# Patient Record
Sex: Female | Born: 1960 | Race: Black or African American | Hispanic: No | Smoking: Never smoker
Health system: Southern US, Community
[De-identification: ages and names within clinical notes are randomized; demographics above are authoritative.]

## PROBLEM LIST (undated history)

## (undated) DIAGNOSIS — K602 Anal fissure, unspecified: Secondary | ICD-10-CM

## (undated) DIAGNOSIS — T7840XA Allergy, unspecified, initial encounter: Secondary | ICD-10-CM

## (undated) DIAGNOSIS — S39012A Strain of muscle, fascia and tendon of lower back, initial encounter: Secondary | ICD-10-CM

## (undated) DIAGNOSIS — D509 Iron deficiency anemia, unspecified: Secondary | ICD-10-CM

## (undated) DIAGNOSIS — N921 Excessive and frequent menstruation with irregular cycle: Secondary | ICD-10-CM

## (undated) HISTORY — DX: Strain of muscle, fascia and tendon of lower back, initial encounter: S39.012A

## (undated) HISTORY — DX: Allergy, unspecified, initial encounter: T78.40XA

---

## 1997-09-25 ENCOUNTER — Emergency Department (HOSPITAL_COMMUNITY): Admission: EM | Admit: 1997-09-25 | Discharge: 1997-09-25 | Payer: Self-pay | Admitting: Emergency Medicine

## 1998-07-02 ENCOUNTER — Other Ambulatory Visit: Admission: RE | Admit: 1998-07-02 | Discharge: 1998-07-02 | Payer: Self-pay | Admitting: *Deleted

## 1999-01-12 ENCOUNTER — Ambulatory Visit (HOSPITAL_COMMUNITY): Admission: RE | Admit: 1999-01-12 | Discharge: 1999-01-12 | Payer: Self-pay | Admitting: Chiropractic Medicine

## 1999-01-12 ENCOUNTER — Encounter: Payer: Self-pay | Admitting: Chiropractic Medicine

## 1999-05-01 ENCOUNTER — Inpatient Hospital Stay (HOSPITAL_COMMUNITY): Admission: AD | Admit: 1999-05-01 | Discharge: 1999-05-01 | Payer: Self-pay | Admitting: Gynecology

## 1999-05-11 ENCOUNTER — Other Ambulatory Visit: Admission: RE | Admit: 1999-05-11 | Discharge: 1999-05-11 | Payer: Self-pay | Admitting: Gynecology

## 1999-05-24 ENCOUNTER — Ambulatory Visit (HOSPITAL_COMMUNITY): Admission: RE | Admit: 1999-05-24 | Discharge: 1999-05-24 | Payer: Self-pay | Admitting: Gynecology

## 1999-07-30 ENCOUNTER — Encounter: Payer: Self-pay | Admitting: Gynecology

## 1999-07-30 ENCOUNTER — Encounter (INDEPENDENT_AMBULATORY_CARE_PROVIDER_SITE_OTHER): Payer: Self-pay | Admitting: Specialist

## 1999-07-30 ENCOUNTER — Inpatient Hospital Stay (HOSPITAL_COMMUNITY): Admission: AD | Admit: 1999-07-30 | Discharge: 1999-08-01 | Payer: Self-pay | Admitting: Gynecology

## 2001-07-08 ENCOUNTER — Encounter: Payer: Self-pay | Admitting: Internal Medicine

## 2001-07-08 ENCOUNTER — Encounter: Admission: RE | Admit: 2001-07-08 | Discharge: 2001-07-08 | Payer: Self-pay | Admitting: Internal Medicine

## 2002-07-29 ENCOUNTER — Other Ambulatory Visit: Admission: RE | Admit: 2002-07-29 | Discharge: 2002-07-29 | Payer: Self-pay | Admitting: Obstetrics and Gynecology

## 2002-10-04 ENCOUNTER — Emergency Department (HOSPITAL_COMMUNITY): Admission: EM | Admit: 2002-10-04 | Discharge: 2002-10-04 | Payer: Self-pay | Admitting: Emergency Medicine

## 2002-10-04 ENCOUNTER — Encounter: Payer: Self-pay | Admitting: Hospitalist

## 2003-07-30 ENCOUNTER — Other Ambulatory Visit: Admission: RE | Admit: 2003-07-30 | Discharge: 2003-07-30 | Payer: Self-pay | Admitting: Obstetrics and Gynecology

## 2009-09-05 ENCOUNTER — Emergency Department (HOSPITAL_COMMUNITY): Admission: EM | Admit: 2009-09-05 | Discharge: 2009-09-05 | Payer: Self-pay | Admitting: Emergency Medicine

## 2009-10-29 ENCOUNTER — Emergency Department (HOSPITAL_COMMUNITY): Admission: EM | Admit: 2009-10-29 | Discharge: 2009-10-29 | Payer: Self-pay | Admitting: Family Medicine

## 2009-11-05 ENCOUNTER — Ambulatory Visit (HOSPITAL_COMMUNITY): Admission: RE | Admit: 2009-11-05 | Discharge: 2009-11-05 | Payer: Self-pay | Admitting: Chiropractic Medicine

## 2010-07-11 LAB — RAPID STREP SCREEN (MED CTR MEBANE ONLY): Streptococcus, Group A Screen (Direct): NEGATIVE

## 2010-09-09 NOTE — Op Note (Signed)
[facility]  Patient:    Sophia Cruz                        MRN: [id number] Proc. Date: 05/24/99 Adm. Date:  [id number] Disc. Date: [id number] Attending:  Merrily Pew                           Operative Report  PREOPERATIVE DIAGNOSIS:       Pregnancy at 13 weeks.  History of cervical incompetence.  POSTOPERATIVE DIAGNOSIS:      Pregnancy at 13 weeks.  History of cervical incompetence.  OPERATION:                    McDonald type cervical cerclage.  SURGEON:                      Timothy P. Fontaine, M.D.  ASSISTANT:  ANESTHESIA:                   Spinal.  ESTIMATED BLOOD LOSS:         Minimal.  COMPLICATIONS:                None.  SPECIMEN:                     None.  FINDINGS:                     5 mm Mersilene band placed with good cervical support.  DESCRIPTION OF PROCEDURE:     The patient underwent spinal anesthesia and was placed in the dorsal lithotomy position and received a perineal and vaginal preparation with Betadine solution and the bladder emptied with in-and-out Foley catheterization and the patient was draped in the usual fashion.  After having received 1 gram Cefotan IV prophylaxis, the cervix was visualized with cervical  speculum and retractors and a 5 mm Mersilene band cerclage was placed in a McDonald type procedure without difficulty and with good cervical support.  The knot was  tied with multiple throws at 12 oclock.  The vagina was copiously irrigated, adequate hemostasis achieved, the bladder recatheterized with clear yellow urine noted.  The patient was placed in the supine position and subsequently taken to the recovery room in good condition having tolerated the procedure well. DD:  05/24/99 TD:  05/24/99 Job: [id number]

## 2010-09-09 NOTE — H&P (Signed)
[facility]  Patient:    Sophia Cruz, Sophia Cruz                       MRN: [id number] Adm. Date:  [id number] Attending:  Tonye Royalty                         History and Physical  CHIEF COMPLAINT:              Bulging sensation and pressure from vagina.  HISTORY OF PRESENT ILLNESS:   The patient is a 50 year old, gravida 4, para 1, abortus 2, (one preterm delivery).  The patients estimated date of confinement s December 01, 1999.  She is currently 22 weeks into her pregnancy.  The patient stated that she woke up this morning at 5:30 a.m. and felt some lower abdominal pressure and thought she was passing gas.  When she sat in the bathroom, she felt a bulging-like defect in her vagina which she pushed back in and went to work, but continued to have lower abdominal pressure and called [place] early this morning at approximately 6:30 and was instructed to come to [facility] immediately.  The patients prenatal course is significant for the fact that she  had a cerclage that was placed on May 24, 1999, which would place her around 12-1/2 weeks into her pregnancy.  On examination when she came into the emergency room, she was found to have gestational sac in the vagina.  Her cervix appeared to be 3 to 4 cm dilated and perhaps 80 to 90% effaced and a fetal extremity was felt in the sac in the vagina.  Her vital signs were as follows;  temperature 98.2, pulse 102, respirations 24, blood pressure 137/77.   The patient was immediately placed in the Trendelenburg position, was given a shot of Terbutaline 0.25 subcu. She was having irregular mild contractions on the monitor that were more subjective than objective and fetal viability was noted.  ALLERGIES:                    No known drug allergies.  MEDICATIONS:                  Prenatal vitamins only.  PAST MEDICAL HISTORY:         Her first pregnancy resulted in rupture  of membranes at 20 weeks with delivery.  Her second pregnancy, she had an emergency cerclage  placed at 16 weeks and she went on to rupture at 28 weeks with that pregnancy and delivered at [redacted] weeks gestation.  The patient stated that otherwise besides the  cerclage in this pregnancy, she had had no other problems.  She denies alcohol consumption or use of illicit drugs or smoking.  PHYSICAL EXAMINATION:  VITAL SIGNS:                  Temperature 98.2, pulse 103, respirations 124, and blood pressure 137/77.  HEENT:                        Unremarkable.  NECK:                         Supple.  Trachea midline.  No carotid bruits.  No  thyromegaly.  LUNGS:  Clear to auscultation without rhonchis or wheezes.  HEART:                        Regular rate and rhythm with no murmurs or gallops.  BREASTS:                      Not done.  ABDOMEN:                      Gravid uterus, nontender.  PELVIC:                       Cervix 3 to 4 cm, bulging membranes in the vagina, intact.  No gross evidence of rupture of membranes.  Cervix 3 to 4 cm, 80% effaced.  EXTREMITIES:                  DTR 1+, negative clonus, trace edema.  ASSESSMENT:                   A 50 year old, gravida 4, para 1, abortus 2, at [redacted] weeks gestation with bulging membranes in the vaginal vault and dilated 3 to 4 m with cerclage placed on May 24, 1999.  No evidence of fever at the present time.  The patient was placed immediately in Trendelenburg position and will have an ultrasound for estimated fetal weight and to determine more accurately cervical length measurement, nitrazine and ferning tests were done in the emergency room  with results pending.  Also GC and Chlamydia and Group B Strep and wet prep were obtained.  The patient will be placed on magnesium sulfate 4 gram bolus followed by 2 grams per hour and also she will be started on Unasyn 3 grams IV q.6h.  for GBS prophylaxis as well.  Will keep her in steep Trendelenburg, Foley catheter, and  admission lab work to include a CBC and comprehensive metabolic panel and will continue in all efforts for conservative management.  If she continues to contract or rupture her membranes, will proceed then with delivery.  The patient is aware that at [redacted] weeks gestation that if she were to deliver that the fetus would not be able to survive.  If we are fortunate enough to hold her until 26 weeks, then we will begin steroid administration for fetal lung maturation at that point.  PLAN:                         As per assessment above. DD:  07/30/99 TD:  07/30/99 Job: [id number]

## 2010-09-09 NOTE — Discharge Summary (Signed)
[facility]  Patient:    Sophia Cruz, Sophia Cruz                       MRN: [id number] Adm. Date:  [id number] Disc. Date: [id number] Attending:  Tonye Royalty Dictator:   Antony Contras, [name]                           Discharge Summary  DISCHARGE DIAGNOSES:              1. Intrauterine pregnancy at 22 weeks.                                   2. History of cervical cerclage with this                                      pregnancy.                                   3. Spontaneous rupture of membranes and                                      subsequent delivery of nonviable infant.  PROCEDURES:                       1. Removal of cerclage.                                   2. Normal spontaneous vaginal delivery of a                                      nonviable female infant.  HISTORY OF PRESENT ILLNESS:       The patient is a 50 year old gravida 4, para 1, AB2 with one preterm delivery; with an EDC of December 01, 1999.  At 22 weeks into the pregnancy she presented with some lower abdominal pressure, which she thought was gas.  When she came into the emergency room she was found to have a gestational sac in the vagina.  Her cervix was 3-4 cm dilated and 80-90% effaced.  She was immediately placed in Trendelenburg position, given a shot of terbutaline and admitted for conservative management.  HOSPITAL COURSE AND TREATMENT:    The patient was admitted to the hospital, placed in Trendelenburg position, began on magnesium sulfate, given some Unisom, and GC/Chlamydia/GBS cultures were obtained.  Despite this management she continued to contract, the cervix continued to dilate, and it was felt that at this time she was in danger of uterine rupture if the cerclage would remain in place.  This was discussed with her by Dr. Lily Peer and the decision was made to remove the cerclage.  At this point she was given some Pitocin to augment the labor and delivery.   She did progress to deliver a nonviable female infant with Apgars of 0,0.  POSTPARTUM LABS:  CBC -- Hemoglobin 10.0, hematocrit 28.9, platelets 204, WBC 10.2.  DISPOSITION:                      She was provided with pastoral counseling and supportive care.  Postpartum she had no difficulty voiding.  She remained afebrile and was able to be discharged on the first postpartum day in satisfactory condition.  FOLLOW-UP:                        In the office in six weeks.  DISCHARGE MEDICATIONS:            Tylox and Motrin.  Continue with prenatal vitamins for now. DD:  08/19/99 TD:  08/23/99 Job: [id number]

## 2011-01-14 ENCOUNTER — Inpatient Hospital Stay (INDEPENDENT_AMBULATORY_CARE_PROVIDER_SITE_OTHER)
Admission: RE | Admit: 2011-01-14 | Discharge: 2011-01-14 | Disposition: A | Discharge status: Home / Self Care | Payer: Self-pay | Source: Ambulatory Visit | Attending: Emergency Medicine | Admitting: Emergency Medicine

## 2011-01-14 DIAGNOSIS — F411 Generalized anxiety disorder: Secondary | ICD-10-CM

## 2011-03-07 ENCOUNTER — Encounter: Payer: Self-pay | Admitting: Emergency Medicine

## 2011-03-07 ENCOUNTER — Emergency Department (INDEPENDENT_AMBULATORY_CARE_PROVIDER_SITE_OTHER)
Admission: EM | Admit: 2011-03-07 | Discharge: 2011-03-07 | Disposition: A | Discharge status: Home / Self Care | Payer: Self-pay | Source: Home / Self Care | Attending: Family Medicine | Admitting: Family Medicine

## 2011-03-07 ENCOUNTER — Emergency Department (INDEPENDENT_AMBULATORY_CARE_PROVIDER_SITE_OTHER): Payer: Self-pay

## 2011-03-07 DIAGNOSIS — J069 Acute upper respiratory infection, unspecified: Secondary | ICD-10-CM

## 2011-03-07 MED ORDER — FLUTICASONE PROPIONATE 50 MCG/ACT NA SUSP: 2.0000 | Freq: Every day | NASAL | Status: DC

## 2011-03-07 MED ORDER — AZITHROMYCIN 250 MG PO TABS: 250.0000 mg | ORAL_TABLET | Freq: Every day | ORAL | Status: AC

## 2011-03-07 NOTE — ED Notes (Signed)
11/2 was onset of chills, loss of voice, coughing up green phlegm.  C/o headache, sinus congestion.  Cough is "deep, hard" and keeping patient awake at night

## 2011-03-07 NOTE — ED Provider Notes (Addendum)
History     CSN: [id number] Arrival date & time: 03/07/2011 10:28 AM   First MD Initiated Contact with Patient 03/07/11 1108      Chief Complaint  Patient presents with  . Cough    (Consider location/radiation/quality/duration/timing/severity/associated sxs/prior treatment) Patient is a 50 y.o. female presenting with cough. The history is provided by the patient.  Cough This is a recurrent problem. The current episode started more than 1 week ago. The problem has been gradually worsening. The cough is productive of purulent sputum. The maximum temperature recorded prior to her arrival was 100 to 100.9 F. Associated symptoms include ear congestion and rhinorrhea. She is not a smoker. Past medical history comments: h/o chronic sinusitis.    History reviewed. No pertinent past medical history.  History reviewed. No pertinent past surgical history.  History reviewed. No pertinent family history.  History  Substance Use Topics  . Smoking status: Never Smoker   . Smokeless tobacco: Not on file  . Alcohol Use: No    OB History    Grav Para Term Preterm Abortions TAB SAB Ect Mult Living                  Review of Systems  Constitutional: Negative.   HENT: Positive for congestion, rhinorrhea and postnasal drip.   Eyes: Negative.   Respiratory: Positive for cough.   Cardiovascular: Negative.   Skin: Negative.     Allergies  Review of patient's allergies indicates no known allergies.  Home Medications   Current Outpatient Rx  Name Route Sig Dispense Refill  . MUCINEX DM PO Oral Take 1 tablet by mouth 2 (two) times daily at 10 AM and 5 PM.        BP 153/87  Pulse 87  Temp(Src) 98.3 F (36.8 C) (Oral)  Resp 18  SpO2 100%  LMP 02/14/2011  Physical Exam  Constitutional: She appears well-developed and well-nourished.  HENT:  Head: Normocephalic and atraumatic.  Right Ear: External ear normal.  Left Ear: External ear normal.  Nose: Nose normal.  Mouth/Throat:  Oropharynx is clear and moist.  Eyes: Conjunctivae and EOM are normal. Pupils are equal, round, and reactive to light.  Neck: Normal range of motion. Neck supple.  Cardiovascular: Normal rate, regular rhythm, normal heart sounds and intact distal pulses.   Pulmonary/Chest: Effort normal and breath sounds normal.  Skin: Skin is warm and dry.    ED Course  Procedures (including critical care time)  Labs Reviewed - No data to display No results found.   No diagnosis found.    MDM  X-rays reviewed and report per radiologist.        Barkley Bruns, MD 03/07/11 1243  Barkley Bruns, MD 03/07/11 1254

## 2011-07-02 ENCOUNTER — Emergency Department (INDEPENDENT_AMBULATORY_CARE_PROVIDER_SITE_OTHER)
Admission: EM | Admit: 2011-07-02 | Discharge: 2011-07-02 | Disposition: A | Discharge status: Home / Self Care | Payer: Self-pay | Source: Home / Self Care | Attending: Emergency Medicine | Admitting: Emergency Medicine

## 2011-07-02 ENCOUNTER — Encounter (HOSPITAL_COMMUNITY): Payer: Self-pay | Admitting: *Deleted

## 2011-07-02 ENCOUNTER — Emergency Department (INDEPENDENT_AMBULATORY_CARE_PROVIDER_SITE_OTHER): Payer: Self-pay

## 2011-07-02 DIAGNOSIS — L509 Urticaria, unspecified: Secondary | ICD-10-CM

## 2011-07-02 DIAGNOSIS — R059 Cough, unspecified: Secondary | ICD-10-CM

## 2011-07-02 DIAGNOSIS — R05 Cough: Secondary | ICD-10-CM

## 2011-07-02 DIAGNOSIS — R053 Chronic cough: Secondary | ICD-10-CM

## 2011-07-02 MED ORDER — CIMETIDINE 400 MG PO TABS: 400.0000 mg | ORAL_TABLET | Freq: Two times a day (BID) | ORAL | Status: DC

## 2011-07-02 MED ORDER — PREDNISONE 5 MG PO KIT: 1.0000 | PACK | Freq: Every day | ORAL | Status: DC

## 2011-07-02 MED ORDER — METHYLPREDNISOLONE ACETATE 80 MG/ML IJ SUSP
INTRAMUSCULAR | Status: AC
  Filled 2011-07-02: qty 1

## 2011-07-02 MED ORDER — FLUTICASONE PROPIONATE 50 MCG/ACT NA SUSP: 2.0000 | Freq: Every day | NASAL | Status: AC

## 2011-07-02 MED ORDER — METHYLPREDNISOLONE ACETATE PF 80 MG/ML IJ SUSP
80.0000 mg | Freq: Once | INTRAMUSCULAR | Status: AC
  Administered 2011-07-02: 80 mg via INTRAMUSCULAR

## 2011-07-02 MED ORDER — AMOXICILLIN 500 MG PO CAPS: 1000.0000 mg | ORAL_CAPSULE | Freq: Three times a day (TID) | ORAL | Status: AC

## 2011-07-02 MED ORDER — CHLORPHENIRAMINE-PSEUDOEPH 12-120 MG PO TB12: 1.0000 | ORAL_TABLET | Freq: Two times a day (BID) | ORAL | Status: DC

## 2011-07-02 NOTE — ED Notes (Signed)
Pharmacy called re:

## 2011-07-02 NOTE — ED Provider Notes (Signed)
Chief Complaint  Patient presents with  . Allergic Reaction    History of Present Illness:   Sophia Cruz is a 51 year old female who is in today for 2 complaints: A skin rash and a chronic cough.  The skin rash began yesterday. It's very itchy. Seems to come and go. Is localized mostly to the arms and legs. The lesions are raised and red. She's taking Benadryl and Zyrtec with slight relief of her symptoms. She cannot recall any new medications that she's taken. The only thing she ate before the symptoms came on was some [place] food mainly stirfry broccoli, gravy, and rice. She denies any change in soaps, detergents, washing powders, dry or sheets, or fabric softeners. No new clothes. No other suspicious ingestions. No recent illnesses other than as mentioned below.  Her second complaint is that chronic cough going on for about 6 months. She describes thick yellow sputum at times. She denies shortness of breath or chest pain. She does have occasional wheezing but has not had any definite diagnosis of asthma. She does have allergies, nasal congestion, rhinorrhea, sneezing, itching, and itchy, watery eyes. This goes on year round. She has yellowish nasal and postnasal drainage. No sore throat or hoarseness. She also has occasional indigestion heartburn but denies any dysphagia, nausea, vomiting, or evidence of GI bleeding.  Review of Systems:  Other than noted above, the patient denies any of the following symptoms. Systemic:  No fever, chills, sweats, fatigue, myalgias, headache, or anorexia. Eye:  No redness, pain or drainage. ENT:  No earache, nasal congestion, rhinorrhea, sinus pressure, or sore throat. Lungs:  No cough, sputum production, wheezing, shortness of breath.  Cardiovascular:  No chest pain, palpitations, or syncope. GI:  No nausea, vomiting, abdominal pain or diarrhea. GU:  No dysuria, frequency, or hematuria. Skin:  No rash or pruritis.  PMFSH:  Past medical history, family history,  social history, meds, and allergies were reviewed.  Physical Exam:   Vital signs:  BP 142/74  Pulse 82  Temp(Src) 98.1 F (36.7 C) (Oral)  Resp 19  SpO2 99%  LMP 06/19/2011 General:  Alert, in no distress. Eye:  PERRL, full EOMs.  Lids and conjunctivas were normal. ENT:  TMs and canals were normal, without erythema or inflammation.  Nasal mucosa was clear and uncongested, without drainage.  Mucous membranes were moist.  Pharynx was clear, without exudate or drainage.  There were no oral ulcerations or lesions. Neck:  Supple, no adenopathy, tenderness or mass. Thyroid was normal. Lungs:  No respiratory distress.  Lungs were clear to auscultation, without wheezes, rales or rhonchi.  Breath sounds were clear and equal bilaterally. Heart:  Regular rhythm, without gallops, murmers or rubs. Abdomen:  Soft, flat, and non-tender to palpation.  No hepatosplenomagaly or mass. Skin:  Clear, warm, and dry, without rash or lesions.  Labs:   Results for orders placed during the hospital encounter of 09/05/09  RAPID STREP SCREEN      Component Value Range   Streptococcus, Group A Screen (Direct) NEGATIVE  NEGATIVE      Radiology:  Dg Chest 2 View  07/02/2011  *RADIOLOGY REPORT*  Clinical Data: 51 year old female with cough.  CHEST - 2 VIEW  Comparison: None  Findings: The heart size is upper limits of normal. The lungs are clear. There is no evidence of focal airspace disease, pulmonary edema, suspicious pulmonary nodule/mass, pleural effusion, or pneumothorax. No acute bony abnormalities are identified.  IMPRESSION: No evidence of active cardiopulmonary disease.  Original Report Authenticated  By: JEFFREY T. HU, M.D.    Assessment:   Diagnoses that have been ruled out:  None  Diagnoses that are still under consideration:  None  Final diagnoses:  Urticaria - there is no telling what might have caused this, but I suspect it might be the [place] food that she ate last night   Chronic cough -  probably multiple causalities. May be partially postnasal drip, partially asthma, and partially reflux. I like to work with the postnasal drip first of all with antihistamines, nasal sprays, and antibiotics. I suggested she followup with her primary care physician in about 2 weeks. Other options would be asthma treatment with inhalers, proton pump inhibitors or referral to a pulmonologist.     Plan:   1.  The following meds were prescribed:   New Prescriptions   AMOXICILLIN (AMOXIL) 500 MG CAPSULE    Take 2 capsules (1,000 mg total) by mouth 3 (three) times daily.   CHLORPHENIRAMINE-PSEUDOEPH 12-120 MG TB12    Take 1 tablet by mouth 2 (two) times daily.   CIMETIDINE (TAGAMET) 400 MG TABLET    Take 1 tablet (400 mg total) by mouth 2 (two) times daily.   FLUTICASONE (FLONASE) 50 MCG/ACT NASAL SPRAY    Place 2 sprays into the nose daily.   PREDNISONE 5 MG KIT    Take 1 kit (5 mg total) by mouth daily after breakfast. Prednisone 5 mg 6 day dosepack.  Take as directed.   2.  The patient was instructed in symptomatic care and handouts were given. 3.  The patient was told to return if becoming worse in any way, if no better in 3 or 4 days, and given some red flag symptoms that would indicate earlier return.    Reuben Likes, MD 07/02/11 [phone]

## 2011-07-02 NOTE — ED Notes (Signed)
Pharmacy called re: prescription sent for chlorpheniramine-pseudoeph 12/120mg   Does not come in this combination per dr Lorenz Coaster given chlorpheniramine 12mg  bid and otc sudafed -

## 2011-07-02 NOTE — ED Notes (Signed)
CO RASH TO ARMS,LEGS, WITH ITCHING AND SWELLING OF HANDS AND FEET SINCE YESTERDAY, STATES SHE ATE CHINESE VEG WITH RICE AND SAUCE Friday NIGHT, WHICH SHE HAS EATEN BEFORE WITH NO PROBLEMS, ALSO STATES SHE HAS HAD PRODUCTIVE COUGH X 2 WKS, TREATED BUT STATES IT HAS RETURNED.

## 2011-07-02 NOTE — Discharge Instructions (Signed)
Cough, Adult  A cough is a reflex that helps clear your throat and airways. It can help heal the body or may be a reaction to an irritated airway. A cough may only last 2 or 3 weeks (acute) or may last more than 8 weeks (chronic).  CAUSES Acute cough:  Viral or bacterial infections.  Chronic cough:  Infections.   Allergies.   Asthma.   Post-nasal drip.   Smoking.   Heartburn or acid reflux.   Some medicines.   Chronic lung problems (COPD).   Cancer.  SYMPTOMS   Cough.   Fever.   Chest pain.   Increased breathing rate.   High-pitched whistling sound when breathing (wheezing).   Colored mucus that you cough up (sputum).  TREATMENT   A bacterial cough may be treated with antibiotic medicine.   A viral cough must run its course and will not respond to antibiotics.   Your caregiver may recommend other treatments if you have a chronic cough.  HOME CARE INSTRUCTIONS   Only take over-the-counter or prescription medicines for pain, discomfort, or fever as directed by your caregiver. Use cough suppressants only as directed by your caregiver.   Use a cold steam vaporizer or humidifier in your bedroom or home to help loosen secretions.   Sleep in a semi-upright position if your cough is worse at night.   Rest as needed.   Stop smoking if you smoke.  SEEK IMMEDIATE MEDICAL CARE IF:   You have pus in your sputum.   Your cough starts to worsen.   You cannot control your cough with suppressants and are losing sleep.   You begin coughing up blood.   You have difficulty breathing.   You develop pain which is getting worse or is uncontrolled with medicine.   You have a fever.  MAKE SURE YOU:   Understand these instructions.   Will watch your condition.   Will get help right away if you are not doing well or get worse.  Document Released: 10/07/2010 Document Revised: 03/30/2011 Document Reviewed: 10/07/2010 [facility] Patient Information 2012 [place],  [place].Hives Hives (urticaria) are itchy, red, swollen patches on the skin. They may change size, shape, and location quickly and repeatedly. Hives that occur deeper in the skin can cause swelling of the hands, feet, and face. Hives may be an allergic reaction to something you or your child ate, touched, or put on the skin. Hives can also be a reaction to cold, heat, viral infections, medication, insect bites, or emotional stress. Often the cause is hard to find. Hives can come and go for several days to several weeks. Hives are not contagious. HOME CARE INSTRUCTIONS   If the cause of the hives is known, avoid exposure to that source.   To relieve itching and rash:   Apply cold compresses to the skin or take cool water baths. Do not take or give your child hot baths or showers because the warmth will make the itching worse.   The best medicine for hives is an antihistamine. An antihistamine will not cure hives, but it will reduce their severity. You can use an antihistamine available over the counter. This medicine may make your child sleepy. Teenagers should not drive while using this medicine.   Take or give an antihistamine every 6 hours until the hives are completely gone for 24 hours or as directed.   Your child may have other medications prescribed for itching. Give these as directed by your child's caregiver.  You or your child should wear loose fitting clothing, including undergarments. Skin irritations may make hives worse.   Follow-up as directed by your caregiver.  SEEK MEDICAL CARE IF:   You or your child still have considerable itching after taking the medication (prescribed or purchased over the counter).   Joint swelling or pain occurs.  SEEK IMMEDIATE MEDICAL CARE IF:   You have a fever.   Swollen lips or tongue are noticed.   There is difficulty with breathing, swallowing, or tightness in the throat or chest.   Abdominal pain develops.   Your child starts acting very  sick.  These may be the first signs of a life-threatening allergic reaction. THIS IS AN EMERGENCY. Call 911 for medical help. MAKE SURE YOU:   Understand these instructions.   Will watch your condition.   Will get help right away if you are not doing well or get worse.  Document Released: 04/10/2005 Document Revised: 03/30/2011 Document Reviewed: 11/29/2007 [facility] Patient Information 2012 [place], [place].

## 2012-03-20 ENCOUNTER — Ambulatory Visit (INDEPENDENT_AMBULATORY_CARE_PROVIDER_SITE_OTHER): Payer: Self-pay | Admitting: Family Medicine

## 2012-03-20 ENCOUNTER — Encounter: Payer: Self-pay | Admitting: Family Medicine

## 2012-03-20 VITALS — BP 138/73 | HR 90 | Temp 98.4°F | Ht 65.5 in | Wt 203.0 lb

## 2012-03-20 DIAGNOSIS — D649 Anemia, unspecified: Secondary | ICD-10-CM | POA: Insufficient documentation

## 2012-03-20 DIAGNOSIS — J3489 Other specified disorders of nose and nasal sinuses: Secondary | ICD-10-CM

## 2012-03-20 DIAGNOSIS — M545 Low back pain, unspecified: Secondary | ICD-10-CM

## 2012-03-20 DIAGNOSIS — R0981 Nasal congestion: Secondary | ICD-10-CM | POA: Insufficient documentation

## 2012-03-20 NOTE — Assessment & Plan Note (Signed)
Encouraged her to take iron supplement, let her know I plan to check labs when she gets orange card.

## 2012-03-20 NOTE — Assessment & Plan Note (Addendum)
No red flags on exam or history, patient has had two normal MRI's, most recently in 2011.  Gave hand out with home exercise program for low back pain.  Consider PT referral after she gets the orange card.

## 2012-03-20 NOTE — Assessment & Plan Note (Signed)
Chart reviewed, patient has been to urgent care for sinus congestion, and at one point had a CT of her sinuses that were normal.  Feel this is due to allergies, encouraged her to take OTC antihistamine.
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
## 2012-03-20 NOTE — Patient Instructions (Signed)
It was nice to meet you.  Please try the exercises in the attached hand out for your back- you should do them twice a day.  Also, other physical activity like walking is good for your back.   Please make an appointment to see me again after you get the orange card so we can get your lab work done.  I recommend you taking an iron supplement if you have had trouble with anemia in the past.

## 2013-02-17 ENCOUNTER — Emergency Department (HOSPITAL_COMMUNITY)
Admission: EM | Admit: 2013-02-17 | Discharge: 2013-02-17 | Disposition: A | Discharge status: Home / Self Care | Payer: BC Managed Care – PPO | Source: Home / Self Care | Attending: Family Medicine | Admitting: Family Medicine

## 2013-02-17 ENCOUNTER — Emergency Department (INDEPENDENT_AMBULATORY_CARE_PROVIDER_SITE_OTHER): Payer: BC Managed Care – PPO

## 2013-02-17 ENCOUNTER — Encounter (HOSPITAL_COMMUNITY): Payer: Self-pay | Admitting: Emergency Medicine

## 2013-02-17 DIAGNOSIS — J4521 Mild intermittent asthma with (acute) exacerbation: Secondary | ICD-10-CM

## 2013-02-17 DIAGNOSIS — J45901 Unspecified asthma with (acute) exacerbation: Secondary | ICD-10-CM

## 2013-02-17 MED ORDER — IPRATROPIUM BROMIDE 0.02 % IN SOLN
RESPIRATORY_TRACT | Status: AC
  Filled 2013-02-17: qty 2.5

## 2013-02-17 MED ORDER — ALBUTEROL SULFATE (5 MG/ML) 0.5% IN NEBU
INHALATION_SOLUTION | RESPIRATORY_TRACT | Status: AC
  Filled 2013-02-17: qty 1

## 2013-02-17 MED ORDER — METHYLPREDNISOLONE 4 MG PO KIT: PACK | ORAL | Status: DC

## 2013-02-17 MED ORDER — ALBUTEROL SULFATE (5 MG/ML) 0.5% IN NEBU
5.0000 mg | INHALATION_SOLUTION | Freq: Once | RESPIRATORY_TRACT | Status: AC
  Administered 2013-02-17: 5 mg via RESPIRATORY_TRACT

## 2013-02-17 MED ORDER — SODIUM CHLORIDE 0.9 % IJ SOLN
INTRAMUSCULAR | Status: AC
  Filled 2013-02-17: qty 3

## 2013-02-17 MED ORDER — IPRATROPIUM BROMIDE 0.02 % IN SOLN
0.5000 mg | Freq: Once | RESPIRATORY_TRACT | Status: AC
  Administered 2013-02-17: 0.5 mg via RESPIRATORY_TRACT

## 2013-02-17 NOTE — ED Notes (Signed)
Coughing up green phlegm, onset one week ago

## 2013-02-17 NOTE — ED Provider Notes (Signed)
CSN: [id number]     Arrival date & time 02/17/13  1409 History   First MD Initiated Contact with Patient 02/17/13 1523     Chief Complaint  Patient presents with  . Cough   (Consider location/radiation/quality/duration/timing/severity/associated sxs/prior Treatment) Patient is a 52 y.o. female presenting with cough. The history is provided by the patient.  Cough Cough characteristics:  Non-productive, dry and hacking Severity:  Moderate Onset quality:  Gradual Duration:  6 days Progression:  Unchanged Chronicity:  New Smoker: no   Associated symptoms: rhinorrhea, shortness of breath and sinus congestion   Associated symptoms: no fever and no wheezing     Past Medical History  Diagnosis Date  . Allergy   . Lumbar strain    History reviewed. No pertinent past surgical history. Family History  Problem Relation Age of Onset  . Heart disease Mother   . Diabetes Mother   . Hypertension Mother   . Kidney disease Mother   . Diabetes Father   . Cancer Maternal Aunt    History  Substance Use Topics  . Smoking status: Never Smoker   . Smokeless tobacco: Never Used  . Alcohol Use: No   OB History   Grav Para Term Preterm Abortions TAB SAB Ect Mult Living                 Review of Systems  Constitutional: Negative.  Negative for fever.  HENT: Positive for rhinorrhea.   Respiratory: Positive for cough and shortness of breath. Negative for wheezing.   Cardiovascular: Negative.     Allergies  Review of patient's allergies indicates no known allergies.  Home Medications   Current Outpatient Rx  Name  Route  Sig  Dispense  Refill  . Chlorpheniramine-Pseudoeph 12-120 MG TB12   Oral   Take 1 tablet by mouth 2 (two) times daily.   20 each   0   . EXPIRED: fluticasone (FLONASE) 50 MCG/ACT nasal spray   Nasal   Place 2 sprays into the nose daily.   1 g   2   . EXPIRED: fluticasone (FLONASE) 50 MCG/ACT nasal spray   Nasal   Place 2 sprays into the nose daily.   16  g   0   . methylPREDNISolone (MEDROL DOSEPAK) 4 MG tablet      follow package directions, start on tues , take until finished.   21 tablet   0    BP 141/69  Pulse 86  Temp(Src) 97.8 F (36.6 C) (Oral)  Resp 20  SpO2 99%  LMP 02/17/2013 Physical Exam  Nursing note and vitals reviewed. Constitutional: She is oriented to person, place, and time. She appears well-developed and well-nourished. No distress.  HENT:  Head: Normocephalic.  Right Ear: External ear normal.  Left Ear: External ear normal.  Mouth/Throat: Oropharynx is clear and moist.  Eyes: Pupils are equal, round, and reactive to light.  Neck: Normal range of motion. Neck supple.  Cardiovascular: Normal rate, regular rhythm, normal heart sounds and intact distal pulses.   Pulmonary/Chest: She has wheezes. She has rhonchi.  Lymphadenopathy:    She has no cervical adenopathy.  Neurological: She is alert and oriented to person, place, and time.  Skin: Skin is warm and dry.    ED Course  Procedures (including critical care time) Labs Review Labs Reviewed - No data to display Imaging Review Dg Chest 2 View  02/17/2013   CLINICAL DATA:  Shortness of breath and cough  EXAM: CHEST  2 VIEW  COMPARISON:  07/02/2011  FINDINGS: The heart size and mediastinal contours are within normal limits. Both lungs are clear. The visualized skeletal structures are unremarkable.  IMPRESSION: No active cardiopulmonary disease.   Electronically Signed   By: Alcide Clever M.D.   On: 02/17/2013 15:42      MDM  X-rays reviewed and report per radiologist.     Linna Hoff, MD 02/17/13 [phone]

## 2013-02-17 NOTE — ED Notes (Signed)
Instructed to put on gown 

## 2013-02-17 NOTE — ED Notes (Signed)
NO answer x 1

## 2013-03-06 ENCOUNTER — Other Ambulatory Visit (HOSPITAL_COMMUNITY): Payer: Self-pay | Admitting: Internal Medicine

## 2013-03-06 DIAGNOSIS — Z1231 Encounter for screening mammogram for malignant neoplasm of breast: Secondary | ICD-10-CM

## 2013-03-21 ENCOUNTER — Ambulatory Visit (HOSPITAL_COMMUNITY)
Admission: RE | Admit: 2013-03-21 | Discharge: 2013-03-21 | Disposition: A | Discharge status: Home / Self Care | Payer: BC Managed Care – PPO | Source: Ambulatory Visit | Attending: Internal Medicine | Admitting: Internal Medicine

## 2013-03-21 DIAGNOSIS — Z1231 Encounter for screening mammogram for malignant neoplasm of breast: Secondary | ICD-10-CM

## 2013-05-26 ENCOUNTER — Other Ambulatory Visit (HOSPITAL_COMMUNITY): Payer: Self-pay | Admitting: Internal Medicine

## 2013-05-26 ENCOUNTER — Ambulatory Visit (HOSPITAL_COMMUNITY)
Admission: RE | Admit: 2013-05-26 | Discharge: 2013-05-26 | Disposition: A | Discharge status: Home / Self Care | Payer: BC Managed Care – PPO | Source: Ambulatory Visit | Attending: Internal Medicine | Admitting: Internal Medicine

## 2013-05-26 DIAGNOSIS — R0602 Shortness of breath: Secondary | ICD-10-CM

## 2013-05-26 DIAGNOSIS — R05 Cough: Secondary | ICD-10-CM

## 2013-05-26 DIAGNOSIS — R059 Cough, unspecified: Secondary | ICD-10-CM

## 2013-12-10 ENCOUNTER — Inpatient Hospital Stay (HOSPITAL_COMMUNITY): Payer: BC Managed Care – PPO

## 2013-12-10 ENCOUNTER — Encounter (HOSPITAL_COMMUNITY): Payer: Self-pay | Admitting: Emergency Medicine

## 2013-12-10 ENCOUNTER — Inpatient Hospital Stay (HOSPITAL_COMMUNITY)
Admission: EM | Admit: 2013-12-10 | Discharge: 2013-12-11 | DRG: 812 | Disposition: A | Discharge status: Home / Self Care | Payer: BC Managed Care – PPO | Attending: Internal Medicine | Admitting: Internal Medicine

## 2013-12-10 DIAGNOSIS — R0981 Nasal congestion: Secondary | ICD-10-CM

## 2013-12-10 DIAGNOSIS — D500 Iron deficiency anemia secondary to blood loss (chronic): Secondary | ICD-10-CM | POA: Diagnosis present

## 2013-12-10 DIAGNOSIS — K602 Anal fissure, unspecified: Secondary | ICD-10-CM | POA: Diagnosis present

## 2013-12-10 DIAGNOSIS — N920 Excessive and frequent menstruation with regular cycle: Secondary | ICD-10-CM | POA: Diagnosis present

## 2013-12-10 DIAGNOSIS — D649 Anemia, unspecified: Secondary | ICD-10-CM | POA: Diagnosis present

## 2013-12-10 DIAGNOSIS — D509 Iron deficiency anemia, unspecified: Secondary | ICD-10-CM | POA: Diagnosis present

## 2013-12-10 DIAGNOSIS — D62 Acute posthemorrhagic anemia: Principal | ICD-10-CM | POA: Diagnosis present

## 2013-12-10 DIAGNOSIS — N921 Excessive and frequent menstruation with irregular cycle: Secondary | ICD-10-CM | POA: Diagnosis present

## 2013-12-10 HISTORY — DX: Iron deficiency anemia, unspecified: D50.9

## 2013-12-10 HISTORY — DX: Excessive and frequent menstruation with irregular cycle: N92.1

## 2013-12-10 HISTORY — DX: Anal fissure, unspecified: K60.2

## 2013-12-10 LAB — CBC WITH DIFFERENTIAL/PLATELET
Basophils Absolute: 0 10*3/uL (ref 0.0–0.1)
Basophils Relative: 0 % (ref 0–1)
Eosinophils Absolute: 0.1 10*3/uL (ref 0.0–0.7)
Eosinophils Relative: 3 % (ref 0–5)
HCT: 24.5 % — ABNORMAL LOW (ref 36.0–46.0)
Hemoglobin: 7 g/dL — ABNORMAL LOW (ref 12.0–15.0)
Lymphocytes Relative: 37 % (ref 12–46)
Lymphs Abs: 1.1 10*3/uL (ref 0.7–4.0)
MCH: 19 pg — ABNORMAL LOW (ref 26.0–34.0)
MCHC: 28.6 g/dL — ABNORMAL LOW (ref 30.0–36.0)
MCV: 66.6 fL — ABNORMAL LOW (ref 78.0–100.0)
Monocytes Absolute: 0.3 10*3/uL (ref 0.1–1.0)
Monocytes Relative: 11 % (ref 3–12)
Neutro Abs: 1.6 10*3/uL — ABNORMAL LOW (ref 1.7–7.7)
Neutrophils Relative %: 49 % (ref 43–77)
Platelets: 340 10*3/uL (ref 150–400)
RBC: 3.68 MIL/uL — ABNORMAL LOW (ref 3.87–5.11)
RDW: 19.6 % — ABNORMAL HIGH (ref 11.5–15.5)
WBC: 3.1 10*3/uL — ABNORMAL LOW (ref 4.0–10.5)

## 2013-12-10 LAB — URINALYSIS, ROUTINE W REFLEX MICROSCOPIC
Bilirubin Urine: NEGATIVE
Glucose, UA: NEGATIVE mg/dL
Ketones, ur: NEGATIVE mg/dL
Leukocytes, UA: NEGATIVE
Nitrite: NEGATIVE
Protein, ur: 100 mg/dL — AB
Specific Gravity, Urine: 1.023 (ref 1.005–1.030)
Urobilinogen, UA: 0.2 mg/dL (ref 0.0–1.0)
pH: 5.5 (ref 5.0–8.0)

## 2013-12-10 LAB — IRON AND TIBC
Iron: <10 ug/dL — ABNORMAL LOW (ref 42–135)
UIBC: 489 ug/dL — ABNORMAL HIGH (ref 125–400)

## 2013-12-10 LAB — POC URINE PREG, ED: Preg Test, Ur: NEGATIVE

## 2013-12-10 LAB — BASIC METABOLIC PANEL
Anion gap: 14 (ref 5–15)
BUN: 8 mg/dL (ref 6–23)
CO2: 23 mEq/L (ref 19–32)
Calcium: 8.7 mg/dL (ref 8.4–10.5)
Chloride: 105 mEq/L (ref 96–112)
Creatinine, Ser: 0.85 mg/dL (ref 0.50–1.10)
GFR calc Af Amer: 90 mL/min — ABNORMAL LOW (ref >90)
GFR calc non Af Amer: 77 mL/min — ABNORMAL LOW (ref >90)
Glucose, Bld: 93 mg/dL (ref 70–99)
Potassium: 3.9 mEq/L (ref 3.7–5.3)
Sodium: 142 mEq/L (ref 137–147)

## 2013-12-10 LAB — URINE MICROSCOPIC-ADD ON

## 2013-12-10 LAB — POC OCCULT BLOOD, ED: Fecal Occult Bld: NEGATIVE

## 2013-12-10 LAB — ABO/RH: ABO/RH(D): A POS

## 2013-12-10 LAB — RETICULOCYTES
RBC.: 3.4 MIL/uL — ABNORMAL LOW (ref 3.87–5.11)
Retic Count, Absolute: 44.2 10*3/uL (ref 19.0–186.0)
Retic Ct Pct: 1.3 % (ref 0.4–3.1)

## 2013-12-10 LAB — VITAMIN B12: Vitamin B-12: 540 pg/mL (ref 211–911)

## 2013-12-10 LAB — FERRITIN: Ferritin: 3 ng/mL — ABNORMAL LOW (ref 10–291)

## 2013-12-10 LAB — PREPARE RBC (CROSSMATCH)

## 2013-12-10 LAB — FOLATE: Folate: >20 ng/mL

## 2013-12-10 MED ORDER — SODIUM CHLORIDE 0.9 % IV SOLN
Freq: Once | INTRAVENOUS | Status: AC
  Administered 2013-12-10: 16:00:00 via INTRAVENOUS

## 2013-12-10 MED ORDER — MEDROXYPROGESTERONE ACETATE 10 MG PO TABS
30.0000 mg | ORAL_TABLET | Freq: Every day | ORAL | Status: DC
  Administered 2013-12-10 – 2013-12-11 (×2): 30 mg via ORAL
  Filled 2013-12-10 (×3): qty 3

## 2013-12-10 MED ORDER — LACTULOSE 10 GM/15ML PO SOLN
10.0000 g | Freq: Every day | ORAL | Status: DC | PRN
  Filled 2013-12-10: qty 15

## 2013-12-10 MED ORDER — ONDANSETRON HCL 4 MG/2ML IJ SOLN: 4.0000 mg | Freq: Four times a day (QID) | INTRAMUSCULAR | Status: DC | PRN

## 2013-12-10 MED ORDER — ACETAMINOPHEN 650 MG RE SUPP: 650.0000 mg | Freq: Four times a day (QID) | RECTAL | Status: DC | PRN

## 2013-12-10 MED ORDER — ONDANSETRON HCL 4 MG PO TABS: 4.0000 mg | ORAL_TABLET | Freq: Four times a day (QID) | ORAL | Status: DC | PRN

## 2013-12-10 MED ORDER — ALBUTEROL SULFATE (2.5 MG/3ML) 0.083% IN NEBU: 2.5000 mg | INHALATION_SOLUTION | RESPIRATORY_TRACT | Status: DC | PRN

## 2013-12-10 MED ORDER — ACETAMINOPHEN 325 MG PO TABS
650.0000 mg | ORAL_TABLET | Freq: Four times a day (QID) | ORAL | Status: DC | PRN
  Administered 2013-12-10 (×2): 650 mg via ORAL
  Filled 2013-12-10 (×2): qty 2

## 2013-12-10 NOTE — ED Provider Notes (Signed)
CSN: [id number]     Arrival date & time 12/10/13  1048 History   First MD Initiated Contact with Patient 12/10/13 1114     Chief Complaint  Patient presents with  . Abnormal Lab     (Consider location/radiation/quality/duration/timing/severity/associated sxs/prior Treatment) Patient is a 53 y.o. female presenting with general illness.  Illness Location:  Generalized Quality:  Weakness Severity:  Moderate Onset quality:  Gradual Duration:  1 week Timing:  Constant Progression:  Worsening Chronicity:  New Context:  Chronic iron deficiency anemia, recent heavy vaginal bleeding x 3 weeks Relieved by:  Nothing Worsened by:  Nothing Associated symptoms: no abdominal pain, no chest pain, no congestion, no cough, no diarrhea, no fever, no loss of consciousness, no nausea, no rash, no rhinorrhea, no shortness of breath, no sore throat and no vomiting     Past Medical History  Diagnosis Date  . Allergy   . Lumbar strain   . Iron deficiency anemia   . Menorrhagia with irregular cycle   . Anal fissure    History reviewed. No pertinent past surgical history. Family History  Problem Relation Age of Onset  . Heart disease Mother   . Diabetes Mother   . Hypertension Mother   . Kidney disease Mother   . Diabetes Father   . Cancer Maternal Aunt    History  Substance Use Topics  . Smoking status: Never Smoker   . Smokeless tobacco: Never Used  . Alcohol Use: No   OB History   Grav Para Term Preterm Abortions TAB SAB Ect Mult Living                 Review of Systems  Constitutional: Negative for fever and chills.  HENT: Negative for congestion, rhinorrhea and sore throat.   Eyes: Negative for photophobia and visual disturbance.  Respiratory: Negative for cough and shortness of breath.   Cardiovascular: Negative for chest pain and leg swelling.  Gastrointestinal: Negative for nausea, vomiting, abdominal pain, diarrhea and constipation.  Endocrine: Negative for polyphagia and  polyuria.  Genitourinary: Negative for dysuria, flank pain, vaginal bleeding, vaginal discharge and enuresis.  Musculoskeletal: Negative for back pain and gait problem.  Skin: Negative for color change and rash.  Neurological: Negative for dizziness, loss of consciousness, syncope, light-headedness and numbness.  Hematological: Negative for adenopathy. Does not bruise/bleed easily.  All other systems reviewed and are negative.     Allergies  Review of patient's allergies indicates no known allergies.  Home Medications   Prior to Admission medications   Medication Sig Start Date End Date Taking? Authorizing Provider  furosemide (LASIX) 20 MG tablet Take 20 mg by mouth daily as needed for fluid.   Yes Historical Provider, MD  Iron-FA-B Cmp-C-Biot-Probiotic (FUSION PLUS) CAPS Take 1 capsule by mouth daily.   Yes Historical Provider, MD  lactulose (CHRONULAC) 10 GM/15ML solution Take 10 g by mouth daily as needed for mild constipation or moderate constipation.    Yes Historical Provider, MD  medroxyPROGESTERone (PROVERA) 10 MG tablet Take 30 mg by mouth daily.   Yes Historical Provider, MD  fluticasone (FLONASE) 50 MCG/ACT nasal spray Place 2 sprays into the nose daily. 03/07/11 03/06/12  Linna HoffJames D Kindl, MD  fluticasone (FLONASE) 50 MCG/ACT nasal spray Place 2 sprays into the nose daily. 07/02/11 07/01/12  [name], MD   BP 121/63  Pulse 72  Temp(Src) 99.2 F (37.3 C) (Oral)  Resp 16  Ht 5\' 5"  (1.651 m)  Wt 192 lb 14.4 oz (  87.5 kg)  BMI 32.10 kg/m2  SpO2 100% Physical Exam  Vitals reviewed. Constitutional: She is oriented to person, place, and time. She appears well-developed and well-nourished.  HENT:  Head: Normocephalic and atraumatic.  Right Ear: External ear normal.  Left Ear: External ear normal.  Eyes: Conjunctivae and EOM are normal. Pupils are equal, round, and reactive to light.  Neck: Normal range of motion. Neck supple.  Cardiovascular: Normal rate, regular  rhythm, normal heart sounds and intact distal pulses.   Pulmonary/Chest: Effort normal and breath sounds normal.  Abdominal: Soft. Bowel sounds are normal. There is no tenderness.  Genitourinary: Guaiac negative stool.  No bleeding at vaginal introitus  Musculoskeletal: Normal range of motion.  Neurological: She is alert and oriented to person, place, and time.  Skin: Skin is warm and dry.    ED Course  Procedures (including critical care time) Labs Review Labs Reviewed  CBC WITH DIFFERENTIAL - Abnormal; Notable for the following:    WBC 3.1 (*)    RBC 3.68 (*)    Hemoglobin 7.0 (*)    HCT 24.5 (*)    MCV 66.6 (*)    MCH 19.0 (*)    MCHC 28.6 (*)    RDW 19.6 (*)    Neutro Abs 1.6 (*)    All other components within normal limits  BASIC METABOLIC PANEL - Abnormal; Notable for the following:    GFR calc non Af Amer 77 (*)    GFR calc Af Amer 90 (*)    All other components within normal limits  URINALYSIS, ROUTINE W REFLEX MICROSCOPIC - Abnormal; Notable for the following:    APPearance HAZY (*)    Hgb urine dipstick LARGE (*)    Protein, ur 100 (*)    All other components within normal limits  URINE MICROSCOPIC-ADD ON - Abnormal; Notable for the following:    Squamous Epithelial / LPF MANY (*)    Bacteria, UA MANY (*)    All other components within normal limits  RETICULOCYTES - Abnormal; Notable for the following:    RBC. 3.40 (*)    All other components within normal limits  VITAMIN B12  FOLATE  IRON AND TIBC  FERRITIN  OCCULT BLOOD X 1 CARD TO LAB, STOOL  OCCULT BLOOD X 1 CARD TO LAB, STOOL  CBC  POC URINE PREG, ED  POC OCCULT BLOOD, ED  TYPE AND SCREEN  PREPARE RBC (CROSSMATCH)  ABO/RH    Imaging Review Portable Chest 1 View  12/10/2013   CLINICAL DATA:  Low hemoglobin, shortness of breath on exertion.  EXAM: PORTABLE CHEST - 1 VIEW  COMPARISON:  05/26/2013.  FINDINGS: Trachea is midline. Heart size normal. Lungs are clear. No pleural fluid.  IMPRESSION: No  acute findings.   Electronically Signed   By: Leanna Battles M.D.   On: 12/10/2013 15:23     EKG Interpretation None     Hemoglobin  Date Value Ref Range Status  12/10/2013 7.0* 12.0 - 15.0 g/dL Final     MDM   Final diagnoses:  Iron deficiency anemia  Menorrhagia with irregular cycle  Anal fissure  Iron deficiency anemia due to chronic blood loss    54 y.o. female  with pertinent PMH of chronic anemia presents with hemoglobin of 6.6 from primary care Dr.  Patient was in her normal state of health until approximately 3 weeks ago she began having heavy vaginal bleeding. She was seen by a provider who prescribed her Provera.  Bleeding persisted, and  she was followed for her hemoglobins. Her baseline hemoglobin is around 9.  Her hemoglobin on August 10 was 7.8, yesterday it decreased to 6.6.  On arrival today vitals signs and physical exam as above. No syncope.   Labs and imaging as above reviewed. Hemoglobin today 7.0. Consulted the hospitalist for admission type and cross and transfuse 2 units ordered per hospitalist request.  Admitted in stable condition  1. Iron deficiency anemia   2. Menorrhagia with irregular cycle   3. Anal fissure   4. Iron deficiency anemia due to chronic blood loss         Mirian Mo, MD 12/10/13 1714

## 2013-12-10 NOTE — H&P (Addendum)
History and Physical  Sophia Cruz BJY:[id number] DOB: 12/18/1960 DOA: 12/10/2013  Referring physician: Dr. Mirian MoMatthew Gentry, EDP PCP: [name],SAMI, MD  Outpatient Specialists:  1. GYN: Dr. Dierdre ForthVanessa Haygood. 2. GI: Dr. Charna ElizabethJyothi Mann  Chief Complaint: Difficulty breathing and heart racing with activity and low blood count.  HPI: Sophia Cruz is a 53 y.o. female  With history of iron deficiency anemia secondary to chronic menstrual blood loss from irregular and heavy menstrual bleeding, anal fissure, presents to the ED upon advise by PCP, secondary to above symptoms and hemoglobin 6.6 g per DL done in office on 0/86/578/18/15. Patient gives history of irregular and heavy menstrual bleeding with clots, since her teenage years. Hemoglobin in November 2014 was apparently in the 5.2 g per DL range. She was seen by Gyn 3-4 weeks ago and underwent procedure and was told that she has 3-4 small fibroids and biopsy apparently was negative for malignancy. She was started on progesterone 30 mg daily on 11/24/13. Her menstrual periods actually got heavier in the first week. She contacted her GYN doctor and was advised to increase it to 40 mg daily. Despite this, her menstruation continued for approximately 3 weeks and is only now subsiding and is currently spotting only. She gives a long history of dyspnea, palpitations and generalized weakness with minimal exertion such as walking from car park to her workplace which is about 1-2 blocks. Recently she has noted worsening symptoms including yesterday went she had difficulty climbing a small flight of stairs and had to stop. She denies chest pain, dizziness, lightheadedness or passing out. She was seen by GI on 8/2 when labwork showed a very low ferritin 7 Hb 8.2. She was started on iron supplements which she is not able to tolerate secondary to constipation and symptomatic anal fissures. She is scheduled for screening colonoscopy on 12/19/13. She denies frequent NSAID use or  other sources of regular bleeding. In the ED, hemoglobin 7 g per DL and FOBT negative. Hospitalist admission requested.   Review of Systems: All systems reviewed and apart from history of presenting illness, are negative.  Past Medical History  Diagnosis Date  . Allergy   . Lumbar strain   . Iron deficiency anemia   . Menorrhagia with irregular cycle   . Anal fissure    History reviewed. No pertinent past surgical history. Social History:  reports that she has never smoked. She has never used smokeless tobacco. She reports that she does not drink alcohol or use illicit drugs. Divorced. Lives with her adult son. Independent of activities of daily living.  No Known Allergies  Family History  Problem Relation Age of Onset  . Heart disease Mother   . Diabetes Mother   . Hypertension Mother   . Kidney disease Mother   . Diabetes Father   . Cancer Maternal Aunt     Prior to Admission medications   Medication Sig Start Date End Date Taking? Authorizing Provider  furosemide (LASIX) 20 MG tablet Take 20 mg by mouth daily as needed for fluid.   Yes Historical Provider, MD  Iron-FA-B Cmp-C-Biot-Probiotic (FUSION PLUS) CAPS Take 1 capsule by mouth daily.   Yes Historical Provider, MD  lactulose (CHRONULAC) 10 GM/15ML solution Take 10 g by mouth daily as needed for mild constipation or moderate constipation.    Yes Historical Provider, MD  medroxyPROGESTERone (PROVERA) 10 MG tablet Take 30 mg by mouth daily.   Yes Historical Provider, MD  fluticasone (FLONASE) 50 MCG/ACT nasal spray  Place 2 sprays into the nose daily. 03/07/11 03/06/12  Linna Hoff, MD  fluticasone (FLONASE) 50 MCG/ACT nasal spray Place 2 sprays into the nose daily. 07/02/11 07/01/12  Reuben Likes, MD   Physical Exam: Filed Vitals:   12/10/13 1238 12/10/13 1300 12/10/13 1345 12/10/13 1427  BP: 134/68 151/85 141/65 153/66  Pulse:  69 65   Temp:      TempSrc:      Resp: 17 16 17 19   Height:      Weight:      SpO2:  100% 100% 100% 100%     General exam: Moderately built and nourished pleasant middle-aged female patient, lying comfortably supine on the gurney in no obvious distress.  Head, eyes and ENT: Nontraumatic and normocephalic. Pupils equally reacting to light and accommodation. Oral mucosa moist. Mucosal pallor.  Neck: Supple. No JVD, carotid bruit or thyromegaly.  Lymphatics: No lymphadenopathy.  Respiratory system: Clear to auscultation. No increased work of breathing.  Cardiovascular system: S1 and S2 heard, RRR. No JVD, murmurs, gallops, clicks or pedal edema.  Gastrointestinal system: Abdomen is nondistended, soft and nontender. Normal bowel sounds heard. No organomegaly or masses appreciated.  Central nervous system: Alert and oriented. No focal neurological deficits.  Extremities: Symmetric 5 x 5 power. Peripheral pulses symmetrically felt.   Skin: No rashes or acute findings.  Musculoskeletal system: Negative exam.  Psychiatry: Pleasant and cooperative.   Labs on Admission:  Basic Metabolic Panel:  Recent Labs Lab 12/10/13 1158  NA 142  K 3.9  CL 105  CO2 23  GLUCOSE 93  BUN 8  CREATININE 0.85  CALCIUM 8.7   Liver Function Tests: No results found for this basename: AST, ALT, ALKPHOS, BILITOT, PROT, ALBUMIN,  in the last 168 hours No results found for this basename: LIPASE, AMYLASE,  in the last 168 hours No results found for this basename: AMMONIA,  in the last 168 hours CBC:  Recent Labs Lab 12/10/13 1158  WBC 3.1*  NEUTROABS 1.6*  HGB 7.0*  HCT 24.5*  MCV 66.6*  PLT 340   Cardiac Enzymes: No results found for this basename: CKTOTAL, CKMB, CKMBINDEX, TROPONINI,  in the last 168 hours  BNP (last 3 results) No results found for this basename: PROBNP,  in the last 8760 hours CBG: No results found for this basename: GLUCAP,  in the last 168 hours  Radiological Exams on Admission: No results found.  EKG: Normal sinus rhythm without acute  findings.  Assessment/Plan Principal Problem:   Iron deficiency anemia due to chronic blood loss Active Problems:   Menorrhagia with irregular cycle   Anal fissure   1. Symptomatic iron deficiency anemia secondary to chronic menstrual blood loss: Will admit to medical floor. Check anemia panel. FOBT x1 negative. Transfuse 2 units of PRBCs and follow posttransfusion hemoglobin. Based on anemia panel, may consider IV iron given her intolerance to oral iron supplements from constipation and symptomatic anal fissure. She has appointment for screening colonoscopy on 8/28. She will need definitive management of problems #2. 2. Menorrhagia with irregular cycle: Continue progesterone. Outpatient followup with GYN. 3. Anal fissures: Continue when necessary lactulose.     Code Status: Full  Family Communication: Discussed with patient's pastor at bedside after patient's consent.  Disposition Plan: Home possibly 8/20.   Time spent: 50 minutes.  Marcellus Scott, MD, FACP, FHM. [facility] Pager [phone]  If 7PM-7AM, please contact night-coverage [email or web address] Password [facility] 12/10/2013, 2:43 PM

## 2013-12-10 NOTE — ED Notes (Signed)
Attempted report 

## 2013-12-10 NOTE — ED Notes (Addendum)
Pt here with low hema globin sent by PCP; pt with hx of same and on period x 3 weeks; pt sts told was 6.6 and having gen weakness and some intolerance after walking long distances

## 2013-12-11 ENCOUNTER — Telehealth: Payer: Self-pay | Admitting: Hematology and Oncology

## 2013-12-11 DIAGNOSIS — D509 Iron deficiency anemia, unspecified: Secondary | ICD-10-CM

## 2013-12-11 LAB — TYPE AND SCREEN
ABO/RH(D): A POS
Antibody Screen: NEGATIVE
Unit division: 0
Unit division: 0

## 2013-12-11 LAB — CBC
HCT: 28.1 % — ABNORMAL LOW (ref 36.0–46.0)
Hemoglobin: 8.5 g/dL — ABNORMAL LOW (ref 12.0–15.0)
MCH: 20.8 pg — ABNORMAL LOW (ref 26.0–34.0)
MCHC: 30.2 g/dL (ref 30.0–36.0)
MCV: 68.9 fL — ABNORMAL LOW (ref 78.0–100.0)
Platelets: 329 10*3/uL (ref 150–400)
RBC: 4.08 MIL/uL (ref 3.87–5.11)
RDW: 20 % — ABNORMAL HIGH (ref 11.5–15.5)
WBC: 6.4 10*3/uL (ref 4.0–10.5)

## 2013-12-11 MED ORDER — FERROUS SULFATE 325 (65 FE) MG PO TABS: 325.0000 mg | ORAL_TABLET | Freq: Every day | ORAL | Status: AC

## 2013-12-11 MED ORDER — FERROUS SULFATE 325 (65 FE) MG PO TABS
325.0000 mg | ORAL_TABLET | Freq: Three times a day (TID) | ORAL | Status: DC
  Administered 2013-12-11: 325 mg via ORAL
  Filled 2013-12-11: qty 1

## 2013-12-11 MED ORDER — SODIUM CHLORIDE 0.9 % IV SOLN
25.0000 mg | Freq: Once | INTRAVENOUS | Status: AC
  Administered 2013-12-11: 25 mg via INTRAVENOUS
  Filled 2013-12-11: qty 2

## 2013-12-11 MED ORDER — SODIUM CHLORIDE 0.9 % IV SOLN
125.0000 mg | Freq: Once | INTRAVENOUS | Status: AC
  Administered 2013-12-11: 125 mg via INTRAVENOUS
  Filled 2013-12-11 (×2): qty 10

## 2013-12-11 NOTE — Telephone Encounter (Signed)
LEFT MESSAGE AND GAVE NP APPT FOR 08/28 @ 10 W/DR. GORSUCH.

## 2013-12-11 NOTE — Discharge Summary (Signed)
Physician Discharge Summary  Sophia Cruz MRN: [id number] DOB/AGE: 1960/12/13 53 y.o.  PCP: Antonietta Jewel, MD   Admit date: 12/10/2013 Discharge date: 12/11/2013  Discharge Diagnoses:  Acute blood loss anemia Status post transfusion of 2 units of packed red blood cells   Iron deficiency anemia due to chronic blood loss Active Problems:   Menorrhagia with irregular cycle   Anal fissure  Followup recommendations APPT FOR 08/28 @ 10 W/DR. [place] Followup with PCP in 5-7 days    Medication List         ferrous sulfate 325 (65 FE) MG tablet  Take 1 tablet (325 mg total) by mouth daily with breakfast.     fluticasone 50 MCG/ACT nasal spray  Commonly known as:  FLONASE  Place 2 sprays into the nose daily.     fluticasone 50 MCG/ACT nasal spray  Commonly known as:  FLONASE  Place 2 sprays into the nose daily.     furosemide 20 MG tablet  Commonly known as:  LASIX  Take 20 mg by mouth daily as needed for fluid.     FUSION PLUS Caps  Take 1 capsule by mouth daily.     lactulose 10 GM/15ML solution  Commonly known as:  CHRONULAC  Take 10 g by mouth daily as needed for mild constipation or moderate constipation.     medroxyPROGESTERone 10 MG tablet  Commonly known as:  PROVERA  Take 30 mg by mouth daily.        Discharge Condition: Stable Disposition: 01-Home or Self Care   Consults: None  Significant Diagnostic Studies: Portable Chest 1 View  12/10/2013   CLINICAL DATA:  Low hemoglobin, shortness of breath on exertion.  EXAM: PORTABLE CHEST - 1 VIEW  COMPARISON:  05/26/2013.  FINDINGS: Trachea is midline. Heart size normal. Lungs are clear. No pleural fluid.  IMPRESSION: No acute findings.   Electronically Signed   By: Lorin Picket M.D.   On: 12/10/2013 15:23     Microbiology: No results found for this or any previous visit (from the past 240 hour(s)).   Labs: Results for orders placed during the hospital encounter of 12/10/13 (from the past 48  hour(s))  URINALYSIS, ROUTINE W REFLEX MICROSCOPIC     Status: Abnormal   Collection Time    12/10/13 11:36 AM      Result Value Ref Range   Color, Urine YELLOW  YELLOW   APPearance HAZY (*) CLEAR   Specific Gravity, Urine 1.023  1.005 - 1.030   pH 5.5  5.0 - 8.0   Glucose, UA NEGATIVE  NEGATIVE mg/dL   Hgb urine dipstick LARGE (*) NEGATIVE   Bilirubin Urine NEGATIVE  NEGATIVE   Ketones, ur NEGATIVE  NEGATIVE mg/dL   Protein, ur 100 (*) NEGATIVE mg/dL   Urobilinogen, UA 0.2  0.0 - 1.0 mg/dL   Nitrite NEGATIVE  NEGATIVE   Leukocytes, UA NEGATIVE  NEGATIVE  URINE MICROSCOPIC-ADD ON     Status: Abnormal   Collection Time    12/10/13 11:36 AM      Result Value Ref Range   Squamous Epithelial / LPF MANY (*) RARE   WBC, UA 0-2  <3 WBC/hpf   RBC / HPF TOO NUMEROUS TO COUNT  <3 RBC/hpf   Bacteria, UA MANY (*) RARE   Urine-Other MUCOUS PRESENT    POC URINE PREG, ED     Status: None   Collection Time    12/10/13 11:42 AM      Result Value  Ref Range   Preg Test, Ur NEGATIVE  NEGATIVE   Comment:            THE SENSITIVITY OF THIS     METHODOLOGY IS >24 mIU/mL  CBC WITH DIFFERENTIAL     Status: Abnormal   Collection Time    12/10/13 11:58 AM      Result Value Ref Range   WBC 3.1 (*) 4.0 - 10.5 K/uL   RBC 3.68 (*) 3.87 - 5.11 MIL/uL   Hemoglobin 7.0 (*) 12.0 - 15.0 g/dL   HCT 24.5 (*) 36.0 - 46.0 %   MCV 66.6 (*) 78.0 - 100.0 fL   MCH 19.0 (*) 26.0 - 34.0 pg   MCHC 28.6 (*) 30.0 - 36.0 g/dL   RDW 19.6 (*) 11.5 - 15.5 %   Platelets 340  150 - 400 K/uL   Neutrophils Relative % 49  43 - 77 %   Lymphocytes Relative 37  12 - 46 %   Monocytes Relative 11  3 - 12 %   Eosinophils Relative 3  0 - 5 %   Basophils Relative 0  0 - 1 %   Neutro Abs 1.6 (*) 1.7 - 7.7 K/uL   Lymphs Abs 1.1  0.7 - 4.0 K/uL   Monocytes Absolute 0.3  0.1 - 1.0 K/uL   Eosinophils Absolute 0.1  0.0 - 0.7 K/uL   Basophils Absolute 0.0  0.0 - 0.1 K/uL   RBC Morphology ELLIPTOCYTES     Comment: FEW TEARDROP  CELLS NOTED   Smear Review LARGE PLATELETS PRESENT    BASIC METABOLIC PANEL     Status: Abnormal   Collection Time    12/10/13 11:58 AM      Result Value Ref Range   Sodium 142  137 - 147 mEq/L   Potassium 3.9  3.7 - 5.3 mEq/L   Chloride 105  96 - 112 mEq/L   CO2 23  19 - 32 mEq/L   Glucose, Bld 93  70 - 99 mg/dL   BUN 8  6 - 23 mg/dL   Creatinine, Ser 0.85  0.50 - 1.10 mg/dL   Calcium 8.7  8.4 - 10.5 mg/dL   GFR calc non Af Amer 77 (*) >90 mL/min   GFR calc Af Amer 90 (*) >90 mL/min   Comment: (NOTE)     The eGFR has been calculated using the CKD EPI equation.     This calculation has not been validated in all clinical situations.     eGFR's persistently <90 mL/min signify possible Chronic Kidney     Disease.   Anion gap 14  5 - 15  POC OCCULT BLOOD, ED     Status: None   Collection Time    12/10/13  1:22 PM      Result Value Ref Range   Fecal Occult Bld NEGATIVE  NEGATIVE  VITAMIN B12     Status: None   Collection Time    12/10/13  1:51 PM      Result Value Ref Range   Vitamin B-12 540  211 - 911 pg/mL   Comment: Performed at [facility]     Status: None   Collection Time    12/10/13  1:51 PM      Result Value Ref Range   Folate >20.0     Comment: (NOTE)     Reference Ranges            Deficient:  0.4 - 3.3 ng/mL            Indeterminate:   3.4 - 5.4 ng/mL            Normal:              > 5.4 ng/mL     Performed at [facility]     Status: Abnormal   Collection Time    12/10/13  1:51 PM      Result Value Ref Range   Iron <10 (*) 42 - 135 ug/dL   TIBC Not calculated due to Iron <10.  250 - 470 ug/dL   Saturation Ratios Not calculated due to Iron <10.  20 - 55 %   UIBC 489 (*) 125 - 400 ug/dL   Comment: Performed at [facility]     Status: Abnormal   Collection Time    12/10/13  1:51 PM      Result Value Ref Range   Ferritin 3 (*) 10 - 291 ng/mL   Comment: Performed at [facility]     Status: Abnormal   Collection Time    12/10/13  1:51 PM      Result Value Ref Range   Retic Ct Pct 1.3  0.4 - 3.1 %   RBC. 3.40 (*) 3.87 - 5.11 MIL/uL   Retic Count, Manual 44.2  19.0 - 186.0 K/uL  TYPE AND SCREEN     Status: None   Collection Time    12/10/13  2:46 PM      Result Value Ref Range   ABO/RH(D) A POS     Antibody Screen NEG     Sample Expiration 12/13/2013     Unit Number [id number]     Blood Component Type RED CELLS,LR     Unit division 00     Status of Unit ISSUED,FINAL     Transfusion Status OK TO TRANSFUSE     Crossmatch Result Compatible     Unit Number [id number]     Blood Component Type RED CELLS,LR     Unit division 00     Status of Unit ISSUED,FINAL     Transfusion Status OK TO TRANSFUSE     Crossmatch Result Compatible    PREPARE RBC (CROSSMATCH)     Status: None   Collection Time    12/10/13  2:46 PM      Result Value Ref Range   Order Confirmation ORDER PROCESSED BY BLOOD BANK    ABO/RH     Status: None   Collection Time    12/10/13  2:46 PM      Result Value Ref Range   ABO/RH(D) A POS    CBC     Status: Abnormal   Collection Time    12/11/13  4:30 AM      Result Value Ref Range   WBC 6.4  4.0 - 10.5 K/uL   RBC 4.08  3.87 - 5.11 MIL/uL   Hemoglobin 8.5 (*) 12.0 - 15.0 g/dL   Comment: REPEATED TO VERIFY   HCT 28.1 (*) 36.0 - 46.0 %   MCV 68.9 (*) 78.0 - 100.0 fL   MCH 20.8 (*) 26.0 - 34.0 pg   MCHC 30.2  30.0 - 36.0 g/dL   RDW 20.0 (*) 11.5 - 15.5 %   Platelets 329  150 - 400 K/uL   Comment: REPEATED TO VERIFY     PLATELET COUNT CONFIRMED BY SMEAR  HPI : Sophia Cruz is a 53 y.o. female With history of iron deficiency anemia secondary to chronic menstrual blood loss from irregular and heavy menstrual bleeding, anal fissure, presents to the ED upon advise by PCP, secondary to above symptoms and hemoglobin 6.6 g per DL done in office on 12/09/13. Patient gives history of irregular and heavy menstrual  bleeding with clots, since her teenage years. Hemoglobin in November 2014 was apparently in the 5.2 g per DL range. She was seen by Gyn 3-4 weeks ago and underwent procedure and was told that she has 3-4 small fibroids and biopsy apparently was negative for malignancy. She was started on progesterone 30 mg daily on 11/24/13. Her menstrual periods actually got heavier in the first week. She contacted her GYN doctor and was advised to increase it to 40 mg daily. Despite this, her menstruation continued for approximately 3 weeks and is only now subsiding and is currently spotting only. She gives a long history of dyspnea, palpitations and generalized weakness with minimal exertion such as walking from car park to her workplace which is about 1-2 blocks. Recently she has noted worsening symptoms including yesterday went she had difficulty climbing a small flight of stairs and had to stop. She denies chest pain, dizziness, lightheadedness or passing out. She was seen by GI on 8/2 when labwork showed a very low ferritin 7 Hb 8.2. She was started on iron supplements which she is not able to tolerate secondary to constipation and symptomatic anal fissures. She is scheduled for screening colonoscopy on 12/19/13. She denies frequent NSAID use or other sources of regular bleeding. In the ED, hemoglobin 7 g per DL and FOBT negative. Hospitalist admission requested.  HOSPITAL COURSE:  1. Symptomatic iron deficiency anemia secondary to chronic menstrual blood loss: Admitted to medical floor. Anemia panel shows serum iron to be less than 10, ferritin level of 3, normal folate and vitamin B 12. FOBT x1 negative. Status post Transfuse 2 units of PRBCs , posttransfusion hemoglobin of 8.5. Based on anemia panel, and given her intolerance to oral iron supplements from constipation and symptomatic anal fissure, patient is being given IV iron prior to discharge. She has appointment for screening colonoscopy on 8/28. She will need  definitive management of problems #2. 2. Menorrhagia with irregular cycle: Continue progesterone. Outpatient followup with GYN. 3. Anal fissures: Continue when necessary lactulose. 4.    Discharge Exam:  Blood pressure 106/49, pulse 65, temperature 98.2 F (36.8 C), temperature source Oral, resp. rate 16, height _0  (1.651 m), weight 87.5 kg (192 lb 14.4 oz), SpO2 98.00%. General exam: Moderately built and nourished pleasant middle-aged female patient, lying comfortably supine on the gurney in no obvious distress.  Head, eyes and ENT: Nontraumatic and normocephalic. Pupils equally reacting to light and accommodation. Oral mucosa moist. Mucosal pallor.  Neck: Supple. No JVD, carotid bruit or thyromegaly.  Lymphatics: No lymphadenopathy.  Respiratory system: Clear to auscultation. No increased work of breathing.  Cardiovascular system: S1 and S2 heard, RRR. No JVD, murmurs, gallops, clicks or pedal edema.  Gastrointestinal system: Abdomen is nondistended, soft and nontender. Normal bowel sounds heard. No organomegaly or masses appreciated.  Central nervous system: Alert and oriented. No focal neurological deficits.  Extremities: Symmetric 5 x 5 power. Peripheral pulses symmetrically felt.  Skin: No rashes or acute findings.  Musculoskeletal system: Negative exam.  Psychiatry: Pleasant and cooperative.         Discharge Instructions   Diet - low sodium heart healthy  Complete by:  As directed      Increase activity slowly    Complete by:  As directed            Follow-up Information   Follow up with [facility], [place]. Schedule an appointment as soon as possible for a visit in 1 week.   Specialty:  Internal Medicine   Contact information:   [address] [place] [address] [phone]       Signed: Reyne Dumas 12/11/2013, 11:59 AM

## 2013-12-19 ENCOUNTER — Ambulatory Visit: Payer: BC Managed Care – PPO | Admitting: Hematology and Oncology

## 2013-12-19 ENCOUNTER — Ambulatory Visit: Payer: BC Managed Care – PPO

## 2015-07-26 ENCOUNTER — Encounter (HOSPITAL_COMMUNITY): Payer: Self-pay | Admitting: Emergency Medicine

## 2015-07-26 ENCOUNTER — Emergency Department (HOSPITAL_COMMUNITY): Payer: Self-pay

## 2015-07-26 ENCOUNTER — Emergency Department (HOSPITAL_COMMUNITY)
Admission: EM | Admit: 2015-07-26 | Discharge: 2015-07-26 | Disposition: A | Discharge status: Home / Self Care | Payer: Self-pay | Attending: Emergency Medicine | Admitting: Emergency Medicine

## 2015-07-26 DIAGNOSIS — Z8719 Personal history of other diseases of the digestive system: Secondary | ICD-10-CM | POA: Insufficient documentation

## 2015-07-26 DIAGNOSIS — Z87828 Personal history of other (healed) physical injury and trauma: Secondary | ICD-10-CM | POA: Insufficient documentation

## 2015-07-26 DIAGNOSIS — M7918 Myalgia, other site: Secondary | ICD-10-CM

## 2015-07-26 DIAGNOSIS — Y999 Unspecified external cause status: Secondary | ICD-10-CM | POA: Insufficient documentation

## 2015-07-26 DIAGNOSIS — T24211A Burn of second degree of right thigh, initial encounter: Secondary | ICD-10-CM | POA: Insufficient documentation

## 2015-07-26 DIAGNOSIS — Z8742 Personal history of other diseases of the female genital tract: Secondary | ICD-10-CM | POA: Insufficient documentation

## 2015-07-26 DIAGNOSIS — Y9389 Activity, other specified: Secondary | ICD-10-CM | POA: Insufficient documentation

## 2015-07-26 DIAGNOSIS — Z79899 Other long term (current) drug therapy: Secondary | ICD-10-CM | POA: Insufficient documentation

## 2015-07-26 DIAGNOSIS — Y92410 Unspecified street and highway as the place of occurrence of the external cause: Secondary | ICD-10-CM | POA: Insufficient documentation

## 2015-07-26 DIAGNOSIS — Z793 Long term (current) use of hormonal contraceptives: Secondary | ICD-10-CM | POA: Insufficient documentation

## 2015-07-26 DIAGNOSIS — Z7951 Long term (current) use of inhaled steroids: Secondary | ICD-10-CM | POA: Insufficient documentation

## 2015-07-26 DIAGNOSIS — S3992XA Unspecified injury of lower back, initial encounter: Secondary | ICD-10-CM | POA: Insufficient documentation

## 2015-07-26 DIAGNOSIS — T24212A Burn of second degree of left thigh, initial encounter: Secondary | ICD-10-CM | POA: Insufficient documentation

## 2015-07-26 DIAGNOSIS — S79912A Unspecified injury of left hip, initial encounter: Secondary | ICD-10-CM | POA: Insufficient documentation

## 2015-07-26 DIAGNOSIS — T23272A Burn of second degree of left wrist, initial encounter: Secondary | ICD-10-CM | POA: Insufficient documentation

## 2015-07-26 DIAGNOSIS — D509 Iron deficiency anemia, unspecified: Secondary | ICD-10-CM | POA: Insufficient documentation

## 2015-07-26 MED ORDER — BACITRACIN ZINC 500 UNIT/GM EX OINT
TOPICAL_OINTMENT | Freq: Two times a day (BID) | CUTANEOUS | Status: DC
  Filled 2015-07-26: qty 1.8

## 2015-07-26 MED ORDER — HYDROCODONE-ACETAMINOPHEN 5-325 MG PO TABS: ORAL_TABLET | ORAL | Status: DC

## 2015-07-26 MED ORDER — LIDOCAINE-EPINEPHRINE-TETRACAINE (LET) SOLUTION
3.0000 mL | Freq: Once | NASAL | Status: AC
  Administered 2015-07-26: 3 mL via TOPICAL
  Filled 2015-07-26: qty 3

## 2015-07-26 MED ORDER — HYDROCODONE-ACETAMINOPHEN 5-325 MG PO TABS
1.0000 | ORAL_TABLET | Freq: Once | ORAL | Status: AC
  Administered 2015-07-26: 1 via ORAL
  Filled 2015-07-26: qty 1
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
## 2015-07-26 NOTE — Discharge Instructions (Signed)
Take vicodin for breakthrough pain, do not drink alcohol, drive, care for children or do other critical tasks while taking vicodin. ° °Please follow with your primary care doctor in the next 2 days for a check-up. They must obtain records for further management.  ° °Do not hesitate to return to the Emergency Department for any new, worsening or concerning symptoms.  ° °

## 2015-07-26 NOTE — ED Notes (Signed)
Patient transported to X-ray 
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
## 2016-05-16 ENCOUNTER — Encounter (HOSPITAL_COMMUNITY): Payer: Self-pay | Admitting: Emergency Medicine

## 2016-05-16 ENCOUNTER — Emergency Department (HOSPITAL_COMMUNITY)
Admission: EM | Admit: 2016-05-16 | Discharge: 2016-05-16 | Disposition: A | Discharge status: Home / Self Care | Payer: Self-pay | Attending: Emergency Medicine | Admitting: Emergency Medicine

## 2016-05-16 DIAGNOSIS — J029 Acute pharyngitis, unspecified: Secondary | ICD-10-CM | POA: Insufficient documentation

## 2016-05-16 LAB — RAPID STREP SCREEN (MED CTR MEBANE ONLY): Streptococcus, Group A Screen (Direct): NEGATIVE

## 2016-05-16 MED ORDER — AMOXICILLIN-POT CLAVULANATE 875-125 MG PO TABS: 1.0000 | ORAL_TABLET | Freq: Two times a day (BID) | ORAL | 0 refills | Status: DC

## 2016-05-16 NOTE — Discharge Instructions (Signed)
Please read and follow all provided instructions.  Your diagnoses today include:  1. Pharyngitis, unspecified etiology     Tests performed today include:  Strep test: was negative for strep throat  Strep culture: you will be notified if this comes back positive  Vital signs. See below for your results today.   Medications prescribed:   Augmentin - antibiotic  You have been prescribed an antibiotic medicine: take the entire course of medicine even if you are feeling better. Stopping early can cause the antibiotic not to work.  Home care instructions:  Please read the educational materials provided and follow any instructions contained in this packet.  Follow-up instructions: Please follow-up with your primary care provider as needed for further evaluation of your symptoms.  Return instructions:   Please return to the Emergency Department if you experience worsening symptoms.   Return if you have worsening problems swallowing, your neck becomes swollen, you cannot swallow your saliva or your voice becomes muffled.   Return with high persistent fever, persistent vomiting, or if you have trouble breathing.   Please return if you have any other emergent concerns.  Additional Information:  Your vital signs today were: BP 158/96 (BP Location: Right Arm)    Pulse 85    Temp 98.7 F (37.1 C) (Oral)    Resp 16    LMP 03/13/2013    SpO2 100%  If your blood pressure (BP) was elevated above 135/85 this visit, please have this repeated by your doctor within one month. --------------

## 2016-05-16 NOTE — ED Notes (Signed)
Pt states she understands instructions. Home stable with steady gait. 

## 2016-05-16 NOTE — ED Triage Notes (Signed)
Sore throat x 3 dayswas seen on the 1/4 and given antiobiotics and prednisone but 3 days she got sore throat

## 2016-05-16 NOTE — ED Provider Notes (Signed)
[facility]-EMERGENCY DEPT Provider Note   CSN: [id number] Arrival date & time: 05/16/16  [id number]   By signing my name below, I, Sophia Cruz, attest that this documentation has been prepared under the direction and in the presence of  [name] PA-C. Electronically Signed: Clovis PuAvnee Cruz, ED Scribe. 05/16/16. 10:53 AM.   History   Chief Complaint Chief Complaint  Patient presents with  . Sore Throat   The history is provided by the patient. No language interpreter was used.   HPI Comments:  Sophia Cruz is a 56 y.o. female, with a hx of chronic sinusitis, who presents to the Emergency Department complaining of acute onset, moderate sore throat x 3 days. Pt also reports difficulty swallowing, left ear pain, left sided dental pain, continued congestion and a mild cough. She reports her symptoms are consistent with the symptoms she has experienced with strep throat. Pt has taken tylenol extra strength with no relief. Pt denies nausea, vomiting, diarrhea and any other associated symptoms at this time. Pt was seen by her PCP for a cold on 04/27/16 and was prescribed antibiotics and prednisone. No drug allergies noted.   PCP: [name],SAMI, MD  Past Medical History:  Diagnosis Date  . Allergy   . Anal fissure   . Iron deficiency anemia   . Lumbar strain   . Menorrhagia with irregular cycle     Patient Active Problem List   Diagnosis Date Noted  . Iron deficiency anemia due to chronic blood loss 12/10/2013  . Menorrhagia with irregular cycle   . Anal fissure   . Low back pain 03/20/2012  . Nasal congestion 03/20/2012    History reviewed. No pertinent surgical history.  OB History    No data available       Home Medications    Prior to Admission medications   Medication Sig Start Date End Date Taking? Authorizing Provider  albuterol (PROVENTIL HFA;VENTOLIN HFA) 108 (90 Base) MCG/ACT inhaler Inhale 2 puffs into the lungs every 6 (six) hours as needed for wheezing or shortness of  breath.    Historical Provider, MD  docusate sodium (COLACE) 100 MG capsule Take 200 mg by mouth daily.    Historical Provider, MD  ferrous sulfate 325 (65 FE) MG tablet Take 1 tablet (325 mg total) by mouth daily with breakfast. 12/11/13   [name], MD  Flaxseed, Linseed, (FLAX SEED OIL) 1000 MG CAPS Take 2,000 mg by mouth daily.    Historical Provider, MD  fluticasone (FLONASE) 50 MCG/ACT nasal spray Place 2 sprays into the nose daily. 07/02/11 07/01/12  [name], MD  HYDROcodone-acetaminophen (NORCO/VICODIN) 5-325 MG tablet Take 1-2 tablets by mouth every 6 hours as needed for pain and/or cough. 07/26/15   [name], PA-C  HYDROcodone-acetaminophen (NORCO/VICODIN) 5-325 MG tablet Take 1 tablet by mouth every 6 (six) hours as needed for moderate pain. Reported on 07/26/2015    Historical Provider, MD  medroxyPROGESTERone (PROVERA) 10 MG tablet Take 20 mg by mouth daily.     Historical Provider, MD  Multiple Vitamins-Iron (MULTIVITAMINS WITH IRON) TABS tablet Take 1 tablet by mouth daily.    Historical Provider, MD  phentermine (ADIPEX-P) 37.5 MG tablet Take 18.75 mg by mouth daily before breakfast. Takes 1/2 tab    Historical Provider, MD    Family History Family History  Problem Relation Age of Onset  . Heart disease Mother   . Diabetes Mother   . Hypertension Mother   . Kidney disease Mother   . Diabetes  Father   . Cancer Maternal Aunt     Social History Social History  Substance Use Topics  . Smoking status: Never Smoker  . Smokeless tobacco: Never Used  . Alcohol use No     Allergies   Patient has no known allergies.   Review of Systems Review of Systems  Constitutional: Negative for chills, fatigue and fever.  HENT: Positive for congestion, dental problem, ear pain and sore throat. Negative for rhinorrhea and sinus pressure.   Eyes: Negative for redness.  Respiratory: Positive for cough. Negative for wheezing.   Gastrointestinal: Negative for abdominal pain,  diarrhea, nausea and vomiting.  Genitourinary: Negative for dysuria.  Musculoskeletal: Positive for neck pain. Negative for myalgias and neck stiffness.  Skin: Negative for rash.  Neurological: Negative for headaches.  Hematological: Positive for adenopathy.     Physical Exam Updated Vital Signs BP 158/96 (BP Location: Right Arm)   Pulse 85   Temp 98.7 F (37.1 C) (Oral)   Resp 16   LMP 03/13/2013   SpO2 100%   Physical Exam  Constitutional: She is oriented to person, place, and time. She appears well-developed and well-nourished. No distress.  HENT:  Head: Normocephalic and atraumatic.  Right Ear: Tympanic membrane, external ear and ear canal normal.  Left Ear: Tympanic membrane, external ear and ear canal normal.  Nose: Mucosal edema present. No rhinorrhea.  Mouth/Throat: Uvula is midline and mucous membranes are normal. Mucous membranes are not dry. No oral lesions. No trismus in the jaw. No uvula swelling. Posterior oropharyngeal erythema present. No oropharyngeal exudate, posterior oropharyngeal edema or tonsillar abscesses. No tonsillar exudate.  Eyes: Conjunctivae are normal. Right eye exhibits no discharge. Left eye exhibits no discharge.  Neck: Normal range of motion. Neck supple.  Full range of motion of neck. No trismus.  Cardiovascular: Normal rate, regular rhythm and normal heart sounds.   Pulmonary/Chest: Effort normal and breath sounds normal. No respiratory distress. She has no wheezes. She has no rales.  Abdominal: Soft. She exhibits no distension. There is no tenderness.  Lymphadenopathy:    She has cervical adenopathy.  Neurological: She is alert and oriented to person, place, and time.  Skin: Skin is warm and dry.  Psychiatric: She has a normal mood and affect.  Nursing note and vitals reviewed.    ED Treatments / Results  DIAGNOSTIC STUDIES:  Oxygen Saturation is 100% on RA, normal by my interpretation.    COORDINATION OF CARE:  10:51 AM Will  prescribe antibiotics and advised pt to continue to use ibuprofen at home. Discussed treatment plan with pt at bedside and pt agreed to plan.  Labs (all labs ordered are listed, but only abnormal results are displayed) Labs Reviewed  RAPID STREP SCREEN (NOT AT [facility])  CULTURE, GROUP A STREP [facility])     Procedures Procedures (including critical care time)  Medications Ordered in ED Medications - No data to display   Initial Impression / Assessment and Plan / ED Course  I have reviewed the triage vital signs and the nursing notes.  Pertinent labs & imaging results that were available during my care of the patient were reviewed by me and considered in my medical decision making (see chart for details).     Pt rapid strep test negative. Pt is tolerating secretions. Presentation not concerning for peritonsillar abscess or spread of infection to deep spaces of the throat; patent airway. Pt will be discharged with Augmentin per her request. We discussed risks (diarrhea, N/V, allergic reaction, not  working 2/2 viral illness) and benefits of treatment. She strongly feels that she requires a 2nd course of antibiotics for her symptoms. Specific return precautions discussed. Recommended PCP follow up. Pt appears safe for discharge.    Final Clinical Impressions(s) / ED Diagnoses   Final diagnoses:  Pharyngitis, unspecified etiology   Pt with URI. Strep screen negative. Abx prescribed as above after discussion with patient. She appears well, non-toxic. No trismus, decreased range of motion to suggest deep space neck infection.  New Prescriptions Discharge Medication List as of 05/16/2016 10:57 AM    START taking these medications   Details  amoxicillin-clavulanate (AUGMENTIN) 875-125 MG tablet Take 1 tablet by mouth every 12 (twelve) hours., Starting Tue 05/16/2016, Print      I personally performed the services described in this documentation, which was scribed in my presence. The recorded  information has been reviewed and is accurate.     Renne Crigler, PA-C 05/16/16 1223    Doug Sou, MD 05/16/16 [phone]

## 2016-05-17 LAB — CULTURE, GROUP A STREP (THRC)

## 2016-05-24 ENCOUNTER — Emergency Department (HOSPITAL_COMMUNITY)
Admission: EM | Admit: 2016-05-24 | Discharge: 2016-05-24 | Disposition: A | Discharge status: Home / Self Care | Payer: Self-pay | Attending: Emergency Medicine | Admitting: Emergency Medicine

## 2016-05-24 ENCOUNTER — Encounter (HOSPITAL_COMMUNITY): Payer: Self-pay

## 2016-05-24 DIAGNOSIS — J029 Acute pharyngitis, unspecified: Secondary | ICD-10-CM

## 2016-05-24 DIAGNOSIS — Z79899 Other long term (current) drug therapy: Secondary | ICD-10-CM | POA: Insufficient documentation

## 2016-05-24 DIAGNOSIS — L040 Acute lymphadenitis of face, head and neck: Secondary | ICD-10-CM | POA: Insufficient documentation

## 2016-05-24 DIAGNOSIS — I889 Nonspecific lymphadenitis, unspecified: Secondary | ICD-10-CM

## 2016-05-24 MED ORDER — CETIRIZINE-PSEUDOEPHEDRINE ER 5-120 MG PO TB12: 1.0000 | ORAL_TABLET | Freq: Two times a day (BID) | ORAL | 0 refills | Status: AC

## 2016-05-24 NOTE — ED Triage Notes (Addendum)
Pt reports sore throat, "I've been sick for 2 months." She was diagnosed with Pharyngitis on Jan 23 and prescribed Amoxicillin. She has one pill left but reports she is still not getting any better. She reports testing negative for strep throat. She also reports itching to her left ear.

## 2016-05-24 NOTE — ED Notes (Signed)
Pt verbalized understanding of d/c instructions and has no further questions. Pt stable and NAD. Pt to follow up with Dr Ishmael Holterozen ENT

## 2016-05-24 NOTE — Discharge Instructions (Signed)
Call Dr. Lucky Rathkeosen's office to schedule a follow up appointment for further evaluation of your symptoms. Use the medication as directed.

## 2016-05-24 NOTE — ED Provider Notes (Signed)
[facility]EM[facility]Y DEPT Provider Note   CSN: [id number] Arrival date & time: 05/24/16  2027  By signing my name below, I, Sophia SalisburyJoshua Cruz, attest that this documentation has been prepared under the direction and in the presence of non-physician practitioner, Kerrie BuffaloHope Reene Harlacher, NP. Electronically Signed: Nelwyn SalisburyJoshua Cruz, Scribe. 05/24/2016. 9:21 PM.  History   Chief Complaint Chief Complaint  Patient presents with  . Sore Throat   The history is provided by the patient. No language interpreter was used.  Sore Throat  This is a recurrent problem. The current episode started more than 1 week ago. The problem occurs constantly. The problem has not changed since onset.Pertinent negatives include no abdominal pain. Nothing aggravates the symptoms. Treatments tried: Amoxicillin and Prednisone. The treatment provided no relief.    HPI Comments:  Sophia Cruz is a 56 y.o. female with hx of chronic sinusitis who presents to the Emergency Department complaining of persistent, unchanged sore throat and sinus congestion. She reports associated left-sided ear pain. No modifying factors indicated. Patient reports that she was evaluated by her PCP about a month ago and treated with antibiotics and prednisone. She reports taking all the medication as directed.  Pt states she was seen recently in the ED for continued problems and given Augmentin, which she has taken and has only one pill left. She states she still has gland swelling and feels that she needs more antibiotics.  No drug allergies noted. She denies any fever and has improved since the last visit.  Per notes from last ED visit, the PA discussed with the patient that antibiotics may cause diarrhea and problems if she continued and that if her illness was not bacterial that the antibiotics would not help.   Past Medical History:  Diagnosis Date  . Allergy   . Anal fissure   . Iron deficiency anemia   . Lumbar strain   . Menorrhagia with irregular cycle      Patient Active Problem List   Diagnosis Date Noted  . Iron deficiency anemia due to chronic blood loss 12/10/2013  . Menorrhagia with irregular cycle   . Anal fissure   . Low back pain 03/20/2012  . Nasal congestion 03/20/2012    History reviewed. No pertinent surgical history.  OB History    No data available       Home Medications    Prior to Admission medications   Medication Sig Start Date End Date Taking? Authorizing Provider  albuterol (PROVENTIL HFA;VENTOLIN HFA) 108 (90 Base) MCG/ACT inhaler Inhale 2 puffs into the lungs every 6 (six) hours as needed for wheezing or shortness of breath.    Historical Provider, MD  amoxicillin-clavulanate (AUGMENTIN) 875-125 MG tablet Take 1 tablet by mouth every 12 (twelve) hours. 05/16/16   Renne CriglerJoshua Geiple, PA-C  cetirizine-pseudoephedrine (ZYRTEC-D) 5-120 MG tablet Take 1 tablet by mouth 2 (two) times daily. 05/24/16   Mykayla Brinton Orlene OchM Deola Rewis, NP  docusate sodium (COLACE) 100 MG capsule Take 200 mg by mouth daily.    Historical Provider, MD  ferrous sulfate 325 (65 FE) MG tablet Take 1 tablet (325 mg total) by mouth daily with breakfast. 12/11/13   [name], MD  Flaxseed, Linseed, (FLAX SEED OIL) 1000 MG CAPS Take 2,000 mg by mouth daily.    Historical Provider, MD  fluticasone (FLONASE) 50 MCG/ACT nasal spray Place 2 sprays into the nose daily. 07/02/11 07/01/12  [name], MD  HYDROcodone-acetaminophen (NORCO/VICODIN) 5-325 MG tablet Take 1 tablet by mouth every 6 (six) hours as needed  for moderate pain. Reported on 07/26/2015    Historical Provider, MD  medroxyPROGESTERone (PROVERA) 10 MG tablet Take 20 mg by mouth daily.     Historical Provider, MD  Multiple Vitamins-Iron (MULTIVITAMINS WITH IRON) TABS tablet Take 1 tablet by mouth daily.    Historical Provider, MD  phentermine (ADIPEX-P) 37.5 MG tablet Take 18.75 mg by mouth daily before breakfast. Takes 1/2 tab    Historical Provider, MD    Family History Family History  Problem  Relation Age of Onset  . Heart disease Mother   . Diabetes Mother   . Hypertension Mother   . Kidney disease Mother   . Diabetes Father   . Cancer Maternal Aunt     Social History Social History  Substance Use Topics  . Smoking status: Never Smoker  . Smokeless tobacco: Never Used  . Alcohol use No     Allergies   Patient has no known allergies.   Review of Systems Review of Systems  Constitutional: Negative for fever.  HENT: Positive for ear pain and sore throat. Sinus pressure: chronic sinusitis.   Gastrointestinal: Negative for abdominal pain, diarrhea and vomiting.     Physical Exam Updated Vital Signs BP 146/72 (BP Location: Left Arm)   Pulse 80   Temp 98.4 F (36.9 C) (Oral)   Resp 18   LMP 03/13/2013   SpO2 99%   Physical Exam  Constitutional: She is oriented to person, place, and time. She appears well-developed and well-nourished. No distress.  HENT:  Head: Normocephalic and atraumatic.  Right Ear: Tympanic membrane and ear canal normal.  Left Ear: Ear canal normal.  Nose: Mucosal edema present.  Mouth/Throat: Uvula is midline, oropharynx is clear and moist and mucous membranes are normal. No oropharyngeal exudate or posterior oropharyngeal edema.  Tonsils with erythema, no exudate. Left TM dull and retracted.   Eyes: EOM are normal.  Neck: Neck supple.  Palpable anterior cervical node on left side.  Cardiovascular: Normal rate and regular rhythm.   Pulmonary/Chest: Effort normal and breath sounds normal.  Abdominal: Soft. There is no tenderness.  Musculoskeletal: Normal range of motion.  Lymphadenopathy:    She has cervical adenopathy (left).  Neurological: She is alert and oriented to person, place, and time.  Skin: Skin is warm and dry.  Psychiatric: She has a normal mood and affect. Her behavior is normal.  Nursing note and vitals reviewed.    ED Treatments / Results  DIAGNOSTIC STUDIES:  Oxygen Saturation is 99% on RA, normal by my  interpretation.    COORDINATION OF CARE:  9:29PM Discussed treatment plan with pt at bedside which includes consult with attending physician and pt agreed to plan.  Labs (all labs ordered are listed, but only abnormal results are displayed) Labs Reviewed - No data to display  Radiology No results found.  Procedures Procedures (including critical care time)  Medications Ordered in ED Medications - No data to display   Initial Impression / Assessment and Plan / ED Course  I have reviewed the triage vital signs and the nursing notes.  I discussed this case with Dr. Clarene Duke and we will not give a third round of antibiotics due to possible side effects including C-Diff. I explained this to the patient and discussed need for f/u with ENT for her chronic sinusitis. I gave her a referral and encouraged her to go. Patient still thinks that she needs more antibiotics. Will start Zyrtec.   Final Clinical Impressions(s) / ED Diagnoses   Final  diagnoses:  Sore throat  Cervical lymphadenitis    New Prescriptions Discharge Medication List as of 05/24/2016  9:57 PM    START taking these medications   Details  cetirizine-pseudoephedrine (ZYRTEC-D) 5-120 MG tablet Take 1 tablet by mouth 2 (two) times daily., Starting Wed 05/24/2016, Print      I personally performed the services described in this documentation, which was scribed in my presence. The recorded information has been reviewed and is accurate.     [place], NP 05/25/16 0034    Laurence Spates, MD 05/25/16 [phone]

## 2017-05-30 ENCOUNTER — Other Ambulatory Visit: Payer: Self-pay | Admitting: Internal Medicine

## 2017-05-30 DIAGNOSIS — Z1231 Encounter for screening mammogram for malignant neoplasm of breast: Secondary | ICD-10-CM

## 2017-06-25 ENCOUNTER — Inpatient Hospital Stay: Admission: RE | Admit: 2017-06-25 | Payer: Self-pay | Source: Ambulatory Visit

## 2017-07-17 ENCOUNTER — Ambulatory Visit
Admission: RE | Admit: 2017-07-17 | Discharge: 2017-07-17 | Disposition: A | Discharge status: Home / Self Care | Payer: BLUE CROSS/BLUE SHIELD | Source: Ambulatory Visit | Attending: Internal Medicine | Admitting: Internal Medicine

## 2017-07-17 DIAGNOSIS — Z1231 Encounter for screening mammogram for malignant neoplasm of breast: Secondary | ICD-10-CM

## 2018-11-04 ENCOUNTER — Other Ambulatory Visit: Payer: Self-pay | Admitting: Internal Medicine

## 2018-11-04 DIAGNOSIS — Z1231 Encounter for screening mammogram for malignant neoplasm of breast: Secondary | ICD-10-CM

## 2018-11-05 ENCOUNTER — Other Ambulatory Visit: Payer: Self-pay

## 2018-11-05 ENCOUNTER — Emergency Department (HOSPITAL_COMMUNITY)
Admission: EM | Admit: 2018-11-05 | Discharge: 2018-11-05 | Disposition: A | Discharge status: Home / Self Care | Payer: BLUE CROSS/BLUE SHIELD | Attending: Emergency Medicine | Admitting: Emergency Medicine

## 2018-11-05 ENCOUNTER — Emergency Department (HOSPITAL_COMMUNITY): Payer: BLUE CROSS/BLUE SHIELD

## 2018-11-05 ENCOUNTER — Encounter (HOSPITAL_COMMUNITY): Payer: Self-pay

## 2018-11-05 DIAGNOSIS — Z79899 Other long term (current) drug therapy: Secondary | ICD-10-CM | POA: Diagnosis not present

## 2018-11-05 DIAGNOSIS — U071 COVID-19: Secondary | ICD-10-CM

## 2018-11-05 DIAGNOSIS — R05 Cough: Secondary | ICD-10-CM | POA: Diagnosis not present

## 2018-11-05 DIAGNOSIS — R059 Cough, unspecified: Secondary | ICD-10-CM

## 2018-11-05 DIAGNOSIS — R062 Wheezing: Secondary | ICD-10-CM | POA: Diagnosis present

## 2018-11-05 MED ORDER — BENZONATATE 100 MG PO CAPS: 100.0000 mg | ORAL_CAPSULE | Freq: Three times a day (TID) | ORAL | 0 refills | Status: DC

## 2018-11-05 NOTE — ED Triage Notes (Signed)
Started feeling sick 6/12, was sick and went to health department and had a positive result for Covid, patient has been home in quarantine, Now she is having increased shortness of breath with wheezing, patient has been on prednisone x 2.  2 types of antibiotics.

## 2018-11-05 NOTE — ED Provider Notes (Signed)
[facility]   CSN: [id number] Arrival date & time: 11/05/18  1529  History   Chief Complaint Chief Complaint  Patient presents with  . Wheezing   HPI Sophia Cruz is a 58 y.o. female with past medical history significant for seasonal allergies, iron deficiency anemia who presents for evaluation of cough.  Patient states she has been treated outpatient by PCP in the health department for COVID over the last 3 weeks.  Has recently been on Augmentin, prednisone and albuterol inhaler.  Patient states she feels short of breath when she coughs.  She denies any chest pain.  She has no hemoptysis.  She is tolerating p.o. intake at home without difficulty.  Patient states she was originally sent here for a chest x-ray and they placed her in the emergency department.  Patient states "since I am ready seeing you I guess I will just stay and be evaluated."  Denies fever, chills, nausea, vomiting, headache, congestion, rhinorrhea, sore throat, chest pain, mopped assist, abdominal pain, diarrhea dysuria.  No lower extremity edema, erythema, ecchymosis or warmth.  No history of PE or DVT.  Denies additional aggravating or alleviating factors.  History obtained by patient and prior medical records.  No interpreter was used.     HPI  Past Medical History:  Diagnosis Date  . Allergy   . Anal fissure   . Iron deficiency anemia   . Lumbar strain   . Menorrhagia with irregular cycle     Patient Active Problem List   Diagnosis Date Noted  . Iron deficiency anemia due to chronic blood loss 12/10/2013  . Menorrhagia with irregular cycle   . Anal fissure   . Low back pain 03/20/2012  . Nasal congestion 03/20/2012    History reviewed. No pertinent surgical history.   OB History   No obstetric history on file.      Home Medications    Prior to Admission medications   Medication Sig Start Date End Date Taking? Authorizing Provider   albuterol (PROVENTIL HFA;VENTOLIN HFA) 108 (90 Base) MCG/ACT inhaler Inhale 2 puffs into the lungs every 6 (six) hours as needed for wheezing or shortness of breath.    [provider]  amoxicillin-clavulanate (AUGMENTIN) 875-125 MG tablet Take 1 tablet by mouth every 12 (twelve) hours. 05/16/16   Renne CriglerGeiple, Joshua, PA-C  benzonatate (TESSALON) 100 MG capsule Take 1 capsule (100 mg total) by mouth every 8 (eight) hours. 11/05/18   ,  A, PA-C  cetirizine-pseudoephedrine (ZYRTEC-D) 5-120 MG tablet Take 1 tablet by mouth 2 (two) times daily. 05/24/16   Janne NapoleonNeese, Hope M, NP  docusate sodium (COLACE) 100 MG capsule Take 200 mg by mouth daily.    [provider]  ferrous sulfate 325 (65 FE) MG tablet Take 1 tablet (325 mg total) by mouth daily with breakfast. 12/11/13   Richarda OverlieAbrol, Nayana, MD  Flaxseed, Linseed, (FLAX SEED OIL) 1000 MG CAPS Take 2,000 mg by mouth daily.    [provider]  fluticasone (FLONASE) 50 MCG/ACT nasal spray Place 2 sprays into the nose daily. 07/02/11 07/01/12  Reuben LikesKeller, David C, MD  HYDROcodone-acetaminophen (NORCO/VICODIN) 5-325 MG tablet Take 1 tablet by mouth every 6 (six) hours as needed for moderate pain. Reported on 07/26/2015    [provider]  medroxyPROGESTERone (PROVERA) 10 MG tablet Take 20 mg by mouth daily.     [provider]  Multiple Vitamins-Iron (MULTIVITAMINS WITH IRON) TABS tablet Take 1 tablet by mouth daily.  [provider]  phentermine (ADIPEX-P) 37.5 MG tablet Take 18.75 mg by mouth daily before breakfast. Takes 1/2 tab    [provider]    Family History Family History  Problem Relation Age of Onset  . Heart disease Mother   . Diabetes Mother   . Hypertension Mother   . Kidney disease Mother   . Diabetes Father   . Cancer Maternal Aunt     Social History Social History   Tobacco Use  . Smoking status: Never Smoker  . Smokeless tobacco: Never Used  Substance Use Topics  .  Alcohol use: No  . Drug use: No     Allergies   Patient has no known allergies.   Review of Systems Review of Systems  Constitutional: Negative.   HENT: Negative.   Eyes: Negative.   Respiratory: Positive for cough and shortness of breath (with coughing).   Cardiovascular: Negative.   Gastrointestinal: Negative.   Genitourinary: Negative.   Musculoskeletal: Negative.   Neurological: Negative.   All other systems reviewed and are negative.    Physical Exam Updated Vital Signs BP (!) 145/72 (BP Location: Right Arm)   Pulse 84   Temp 98.1 F (36.7 C) (Oral)   Resp 18   Ht 5\' 5"  (1.651 m)   Wt 104.3 kg   LMP 03/13/2013   SpO2 94%   BMI 38.27 kg/m   Physical Exam Vitals signs and nursing note reviewed.  Constitutional:      General: She is not in acute distress.    Appearance: She is not ill-appearing, toxic-appearing or diaphoretic.  HENT:     Head: Normocephalic and atraumatic.     Jaw: There is normal jaw occlusion.     Right Ear: Tympanic membrane, ear canal and external ear normal. There is no impacted cerumen. No hemotympanum. Tympanic membrane is not injected, scarred, perforated, erythematous, retracted or bulging.     Left Ear: Tympanic membrane, ear canal and external ear normal. There is no impacted cerumen. No hemotympanum. Tympanic membrane is not injected, scarred, perforated, erythematous, retracted or bulging.     Ears:     Comments: No Mastoid tenderness.    Nose:     Comments: Clear rhinorrhea and congestion to bilateral nares.  No sinus tenderness.    Mouth/Throat:     Comments: Posterior oropharynx clear.  Mucous membranes moist.  Tonsils without erythema or exudate.  Uvula midline without deviation.  No evidence of PTA or RPA.  No drooling, dysphasia or trismus.  Phonation normal. Neck:     Trachea: Trachea and phonation normal.     Meningeal: Brudzinski's sign and Kernig's sign absent.     Comments: No Neck stiffness or neck rigidity.  No  meningismus.  No cervical lymphadenopathy. Cardiovascular:     Comments: No murmurs rubs or gallops. Pulmonary:     Comments: Clear to auscultation bilaterally without wheeze, rhonchi or rales.  No accessory muscle usage.  Able speak in full sentences. Abdominal:     Comments: Soft, nontender without rebound or guarding.  No CVA tenderness.  Musculoskeletal:     Comments: Moves all 4 extremities without difficulty.  Lower extremities without edema, erythema or warmth.  Skin:    Comments: Brisk capillary refill.  No rashes or lesions.  Neurological:     Mental Status: She is alert.     Comments: Ambulatory in department without difficulty.  Cranial nerves II through XII grossly intact.  No facial droop.  No aphasia.  ED Treatments / Results  Labs (all labs ordered are listed, but only abnormal results are displayed) Labs Reviewed - No data to display  EKG EKG Interpretation  Date/Time:  Tuesday November 05 2018 16:37:52 EDT Ventricular Rate:  71 PR Interval:    QRS Duration: 91 QT Interval:  370 QTC Calculation: 402 R Axis:   64 Text Interpretation:  Sinus rhythm Borderline T wave abnormalities Otherwise within normal limits Confirmed by Carmin Muskrat [phone]) on 11/05/2018 6:11:38 PM   Radiology Dg Chest Portable 1 View  Result Date: 11/05/2018 CLINICAL DATA:  Increased shortness of breath and wheezing. Has been taking prednisone. COVID-19 positive. EXAM: PORTABLE CHEST 1 VIEW COMPARISON:  12/10/2013. FINDINGS: The cardiac silhouette remains borderline enlarged. Clear lungs with normal vascularity. Thoracic spine degenerative changes. IMPRESSION: No acute abnormality. Electronically Signed   By: Claudie Revering M.D.   On: 11/05/2018 17:02    Procedures Procedures (including critical care time)  Medications Ordered in ED Medications - No data to display  Initial Impression / Assessment and Plan / ED Course  I have reviewed the triage vital signs and the nursing notes.   Pertinent labs & imaging results that were available during my care of the patient were reviewed by me and considered in my medical decision making (see chart for details).  58 year old female who appears otherwise well presents for evaluation of cough. Afebrile, non septic, non ill appearing. Seen by PCP and diagnosed with COVID 3x weeks ago. Heart and lungs clear. No CP, hemoptysis, lower extremity edema, erythema or warmth. No tachycardia, tachypnea or hypoxia. Abd soft nontender without rebound or guarding. Placed on Augmentin, prednisone, albuterol by PCP. Originally sent to [facility] for chest xray with paper order however states "Since im here I might as well be evaluated."   EKG without ST/T changes.  No STEMI. Chest x-ray without evidence of infiltrates, cardiomegaly, pulmonary edema, pneumothorax  Patient ambulatory with oxygen saturation 98% while ambulating.  She has no evidence of acute respiratory distress.  She is tolerating p.o. intake at home without difficulty. No evidence  of sirs or sepsis.  Abdomen soft, nontender without rebound or guarding.  She has no evidence of DVT, PE on exam.  No chest pain.  Likely intermittent shortness of breath and wheezing secondary to COVID 19 infection.  She is already on antibiotics, steroids and albuterol inhaler.  Will DC home with Tessalon Perles for cough.  She appears overall well.  Will have patient follow-up with PCP for reevaluation.  Discussed strict return precautions with patient.  The patient has been appropriately medically screened and/or stabilized in the ED. I have low suspicion for any other emergent medical condition which would require further screening, evaluation or treatment in the ED or require inpatient management.  Patient is hemodynamically stable and in no acute distress.  Patient able to ambulate in department prior to ED.  Evaluation does not show acute pathology that would require ongoing or additional emergent interventions  while in the emergency department or further inpatient treatment.  I have discussed the diagnosis with the patient and answered all questions.  Patient has no further complaints prior to discharge.  Patient is comfortable with plan discussed in room and is stable for discharge at this time.  I have discussed strict return precautions for returning to the emergency department.  Patient was encouraged to follow-up with PCP/specialist refer to at discharge.      Final Clinical Impressions(s) / ED Diagnoses   Final diagnoses:  COVID-19  Cough    ED Discharge Orders         Ordered    benzonatate (TESSALON) 100 MG capsule  Every 8 hours     11/05/18 1816           ,  A, PA-C 11/05/18 1845    Gerhard MunchLockwood, Robert, MD 11/11/18 1925

## 2018-11-05 NOTE — ED Notes (Signed)
Patient states she was sent her for an xray

## 2018-11-05 NOTE — ED Notes (Signed)
Pts oxygen level remained 98 while ambulating.

## 2018-11-05 NOTE — ED Notes (Signed)
Patient's oxygen saturation decreases when she talks and increased RR.

## 2018-11-05 NOTE — Discharge Instructions (Signed)
You to use your albuterol inhaler for your shortness of breath.  I have also given you prescription for Tessalon Perles to help with your cough.  You may also take over-the-counter cough medicine.  Follow-up with PCP for reevaluation.  If you develop worsening shortness of breath, chest pain, coughing up blood please seek reevaluation the emergency department.

## 2018-11-20 ENCOUNTER — Other Ambulatory Visit: Payer: Self-pay | Admitting: Internal Medicine

## 2018-11-20 DIAGNOSIS — Z20822 Contact with and (suspected) exposure to covid-19: Secondary | ICD-10-CM

## 2018-11-21 LAB — NOVEL CORONAVIRUS, NAA: SARS-CoV-2, NAA: NOT DETECTED

## 2018-12-17 ENCOUNTER — Ambulatory Visit: Payer: BLUE CROSS/BLUE SHIELD

## 2018-12-17 ENCOUNTER — Ambulatory Visit
Admission: RE | Admit: 2018-12-17 | Discharge: 2018-12-17 | Disposition: A | Discharge status: Home / Self Care | Payer: BLUE CROSS/BLUE SHIELD | Source: Ambulatory Visit | Attending: Internal Medicine | Admitting: Internal Medicine

## 2018-12-17 ENCOUNTER — Other Ambulatory Visit: Payer: Self-pay

## 2018-12-17 DIAGNOSIS — Z1231 Encounter for screening mammogram for malignant neoplasm of breast: Secondary | ICD-10-CM

## 2019-02-04 ENCOUNTER — Ambulatory Visit: Payer: BLUE CROSS/BLUE SHIELD

## 2019-11-07 ENCOUNTER — Other Ambulatory Visit: Payer: Self-pay | Admitting: Internal Medicine

## 2019-11-07 DIAGNOSIS — Z1231 Encounter for screening mammogram for malignant neoplasm of breast: Secondary | ICD-10-CM

## 2019-11-12 ENCOUNTER — Other Ambulatory Visit: Payer: Self-pay | Admitting: Internal Medicine

## 2019-11-12 DIAGNOSIS — N6452 Nipple discharge: Secondary | ICD-10-CM

## 2019-12-22 ENCOUNTER — Ambulatory Visit: Payer: BLUE CROSS/BLUE SHIELD

## 2019-12-22 ENCOUNTER — Other Ambulatory Visit: Payer: Self-pay

## 2019-12-30 ENCOUNTER — Other Ambulatory Visit: Payer: Self-pay

## 2019-12-30 ENCOUNTER — Other Ambulatory Visit (HOSPITAL_COMMUNITY)
Admission: RE | Admit: 2019-12-30 | Discharge: 2019-12-30 | Disposition: A | Discharge status: Home / Self Care | Payer: No Typology Code available for payment source | Source: Ambulatory Visit | Attending: Obstetrics and Gynecology | Admitting: Obstetrics and Gynecology

## 2019-12-30 ENCOUNTER — Ambulatory Visit: Payer: Self-pay | Admitting: *Deleted

## 2019-12-30 VITALS — BP 138/84 | Temp 97.3°F | Wt 248.3 lb

## 2019-12-30 DIAGNOSIS — N6452 Nipple discharge: Secondary | ICD-10-CM

## 2019-12-30 DIAGNOSIS — Z1239 Encounter for other screening for malignant neoplasm of breast: Secondary | ICD-10-CM

## 2019-12-30 NOTE — Patient Instructions (Addendum)
Explained breast self awareness with [name]. Pap smear is due. Patient refused Pap smear today. Patient scheduled to come to the free Pap smear screening on Monday, February 09, 2020 at 1330. Let her know [facility] will cover Pap smears every 3 years or Pap smears and HPV typing every 5 years unless has a history of abnormal Pap smears. Referred patient to the [facility] for a diagnostic mammogram. Appointment scheduled Thursday, January 01, 2020 at 1010. Patient aware of appointment and will be there. Let patient know will follow up with her within the next couple weeks with results of her breast discharge by letter or phone. Sophia Cruz verbalized understanding.  Letishia Elliott, Kathaleen Maser, RN 3:01 PM

## 2019-12-30 NOTE — Progress Notes (Signed)
Ms. Neleh Muldoon is a 59 y.o. female who presents to [facility] clinic today with complaint of left breast discharge x 30 years that used to only occur when expressed. Patient stated that over the past year the discharge occurs spontaneously and is a clear colored.    Pap Smear: Pap smear not completed today. Last Pap smear was in 2018 at Dr. Ginette Otto office and was normal per patient. Per patient has no history of an abnormal Pap smear. Last Pap smear result is not available in Epic.   Physical exam: Breasts Breasts symmetrical. No skin abnormalities bilateral breasts. No nipple retraction bilateral breasts. No nipple discharge right breast. Expressed a clear/yellowish colored discharge on exam. Sample of discharge sent to Cytology for evaluation. No lymphadenopathy. No lumps palpated bilateral breasts. No complaints of pain or tenderness on exam.       Pelvic/Bimanual Pap smear is due. Patient refused Pap smear today. Patient scheduled to come to the free Pap smear screening on Monday, February 09, 2020 at 1330.   Smoking History: Patient is a former smoker that quit around 30 years ago.   Patient Navigation: Patient education provided. Access to services provided for patient through [facility] program.  Colorectal Cancer Screening: Per patient has had colonoscopy completed on 12/10/2013. No complaints today.    Breast and Cervical Cancer Risk Assessment: Patient does not have family history of breast cancer, known genetic mutations, or radiation treatment to the chest before age 89. Patient does not have history of cervical dysplasia, immunocompromised, or DES exposure in-utero.  Risk Assessment    Risk Scores      12/30/2019   Last edited by: Narda Rutherford, LPN   5-year risk: 1.1 %   Lifetime risk: 5.6 %          A: BCCCP exam without pap smear Complaint of left breast discharge.  P: Referred patient to the [facility] for a diagnostic mammogram. Appointment  scheduled Thursday, January 01, 2020 at 1010.  Priscille Heidelberg, RN 12/30/2019 3:00 PM

## 2020-01-01 ENCOUNTER — Other Ambulatory Visit: Payer: Self-pay

## 2020-01-01 ENCOUNTER — Ambulatory Visit
Admission: RE | Admit: 2020-01-01 | Discharge: 2020-01-01 | Disposition: A | Discharge status: Home / Self Care | Payer: No Typology Code available for payment source | Source: Ambulatory Visit | Attending: Internal Medicine | Admitting: Internal Medicine

## 2020-01-01 ENCOUNTER — Ambulatory Visit
Admission: RE | Admit: 2020-01-01 | Discharge: 2020-01-01 | Disposition: A | Discharge status: Home / Self Care | Payer: Self-pay | Source: Ambulatory Visit | Attending: Internal Medicine | Admitting: Internal Medicine

## 2020-01-01 DIAGNOSIS — N6452 Nipple discharge: Secondary | ICD-10-CM

## 2020-01-02 LAB — CYTOLOGY - NON PAP

## 2020-01-05 NOTE — Progress Notes (Signed)
Patient informed results, she was informed the following from Dr. Hulan Saas. She needs to know if she should follow Dr. Caryl Comes recommendations or go to [facility]. Patient will do whatever is needed.  Benign physiologic nipple discharge based on the chronicity of the discharge and the fact that it emanates from multiple duct orifices. RECOMMENDATION: Screening mammogram in one year.(Code:[id number]-B-01Y)  I discussed with the patient the fact that most nipple discharges are physiologic/benign and a nipple discharge which arises from multiple duct orifices is physiologic and requires no follow-up.  If the nipple discharge ever arises from a single duct orifice or becomes bloody, surgical consultation and a bilateral breast MRI without and with contrast would be recommended.  I have discussed the findings and recommendations with the patient. If applicable, a reminder letter will be sent to the patient regarding the next appointment.

## 2020-01-05 NOTE — Progress Notes (Signed)
Referral to [facility] ?

## 2020-01-19 NOTE — Progress Notes (Signed)
Due to the breast discharge showed Atypical cells we refer to [facility].

## 2020-01-21 ENCOUNTER — Telehealth: Payer: Self-pay

## 2020-01-21 NOTE — Telephone Encounter (Addendum)
Patient informed that a [facility] referral is recommended. Patient states she has had the same work up previously and it is not cancerous, not denying her health, but prefers not to do [facility] referral at this time, feels that she is just getting ol. Patient informed to call if she decides to do recommendation, Pink [facility] expires 12/29/2020. Patient verbalized understanding, will call if needed.   --- Message from Priscille Heidelberg, RN sent at 01/19/2020 10:02 PM EDT ----- Due to the breast discharge showed Atypical cells we refer to [facility].

## 2020-02-09 ENCOUNTER — Ambulatory Visit: Payer: Self-pay

## 2020-02-12 ENCOUNTER — Other Ambulatory Visit: Payer: Self-pay

## 2020-02-17 ENCOUNTER — Telehealth: Payer: Self-pay | Admitting: *Deleted

## 2020-02-17 NOTE — Telephone Encounter (Signed)
Received message to call patient. Patient stated she has already scheduled her Pap smear for 03/29/2020. Patient also stated her skin is breaking out and asked if [facility] covered. Explained to patient that [facility] doesn't cover and asked if patient has the orange card [facility]). Patient stated she doesn't. Explained the orange card and offered to mail her an application. Patient stated she would like an application. Will mail patient an application. Patient verbalized understanding.

## 2020-03-29 ENCOUNTER — Other Ambulatory Visit: Payer: Self-pay

## 2020-03-29 ENCOUNTER — Other Ambulatory Visit: Payer: Self-pay | Admitting: *Deleted

## 2020-03-29 DIAGNOSIS — Z124 Encounter for screening for malignant neoplasm of cervix: Secondary | ICD-10-CM

## 2020-03-29 NOTE — Progress Notes (Signed)
Patient: Sophia Cruz           Date of Birth: October 16, 1960           MRN: [id number] Visit Date: 03/29/2020 PCP: Quitman Livings, MD  Cervical Cancer Screening Do you smoke?: No Have you ever had or been told you have an allergy to latex products?: No Marital status: Divorce Date of last pap smear: 2-5 yrs ago (Negative) Date of last menstrual period:  (Postmenopausal) Number of pregnancies: 3 Number of births: 1 Have you ever had any of the following? Hysterectomy: No Tubal ligation (tubes tied): No Abnormal bleeding: No Abnormal pap smear: Yes Venereal warts: Yes A sex partner with venereal warts: Yes A high risk* sex partner: Yes  Cervical Exam  Abnormal Observations: Normal Exam. Recommendations: Last Pap smear was in 2018 at Dr. Ginette Otto office and was normal per patient. Per patient has no history of an abnormal Pap smear. Last Pap smear result is not available in Epic. Let patient know that if today's Pap smear is normal and HPV negative that her next Pap smear is due in 5 years.      Patient's History Patient Active Problem List   Diagnosis Date Noted  . Iron deficiency anemia due to chronic blood loss 12/10/2013  . Menorrhagia with irregular cycle   . Anal fissure   . Low back pain 03/20/2012  . Nasal congestion 03/20/2012   Past Medical History:  Diagnosis Date  . Allergy   . Anal fissure   . Iron deficiency anemia   . Lumbar strain   . Menorrhagia with irregular cycle     Family History  Problem Relation Age of Onset  . Heart disease Mother   . Diabetes Mother   . Hypertension Mother   . Kidney disease Mother   . Diabetes Father   . Cancer Maternal Aunt     Social History   Occupational History  . Not on file  Tobacco Use  . Smoking status: Never Smoker  . Smokeless tobacco: Never Used  Vaping Use  . Vaping Use: Never used  Substance and Sexual Activity  . Alcohol use: No  . Drug use: No  . Sexual activity: Not Currently

## 2020-03-31 LAB — CYTOLOGY - PAP
Comment: NEGATIVE
Diagnosis: UNDETERMINED — AB
High risk HPV: NEGATIVE

## 2020-04-01 ENCOUNTER — Telehealth: Payer: Self-pay

## 2020-04-01 NOTE — Telephone Encounter (Addendum)
Patient informed pap results, ASC-US, HPV-negative, needs to repeat pap in 1 year. Patient verbalized understanding.   ----- Message from Catalina Antigua, MD sent at 03/31/2020 10:14 AM EST ----- Repeat pap smear in 1 year

## 2021-04-05 ENCOUNTER — Other Ambulatory Visit (HOSPITAL_BASED_OUTPATIENT_CLINIC_OR_DEPARTMENT_OTHER): Payer: Self-pay

## 2022-03-05 IMAGING — MG DIGITAL DIAGNOSTIC BILAT W/ TOMO W/ CAD
6 of 10 series · 6 of 30 positions shown · non-contrast
Comparison: Previous exam(s).

CLINICAL DATA: 59-year-old with a long-standing (30+ years) LEFT
nipple discharge which the patient states occurred only when
expressed for many years, though in the past couple of years the
discharge become intermittently spontaneous. She describes the
discharge as clear and occasionally milky or yellow in color, and
states that it emanates from multiple duct orifices.

Annual evaluation, RIGHT breast.
EXAM:
DIGITAL DIAGNOSTIC BILATERAL MAMMOGRAM WITH CAD AND TOMO
ULTRASOUND LEFT BREAST

[R CC synth-2D]
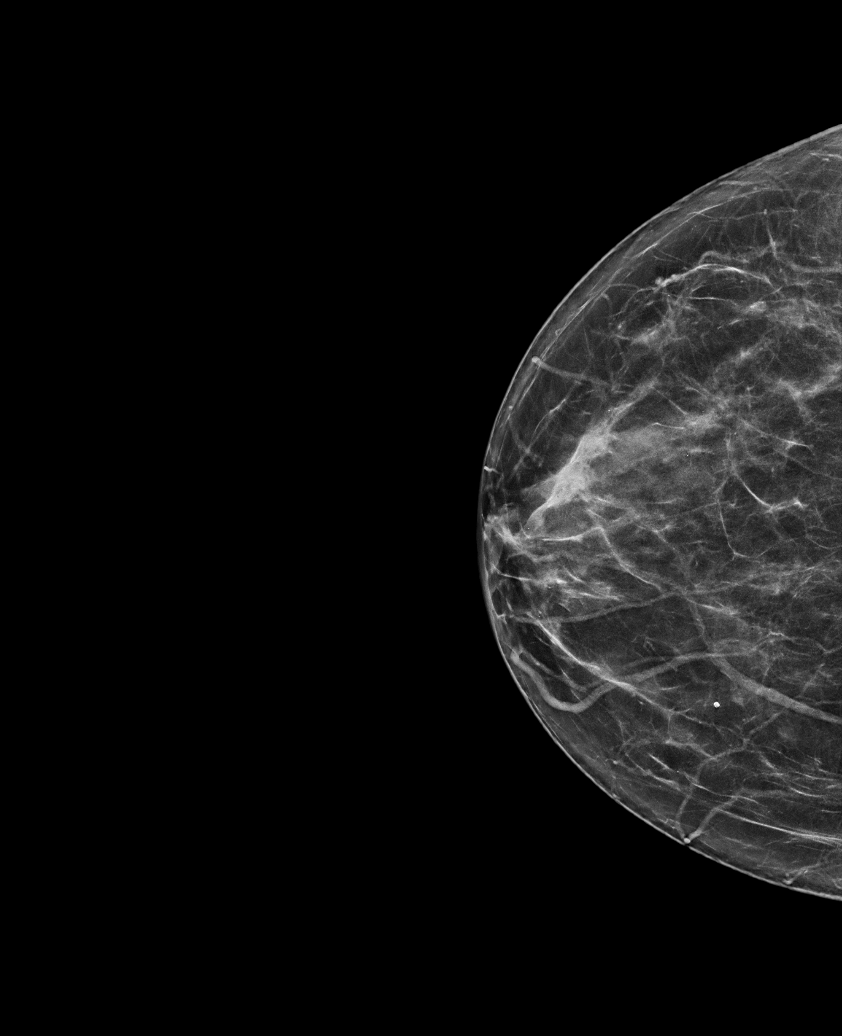

[R MLO synth-2D]
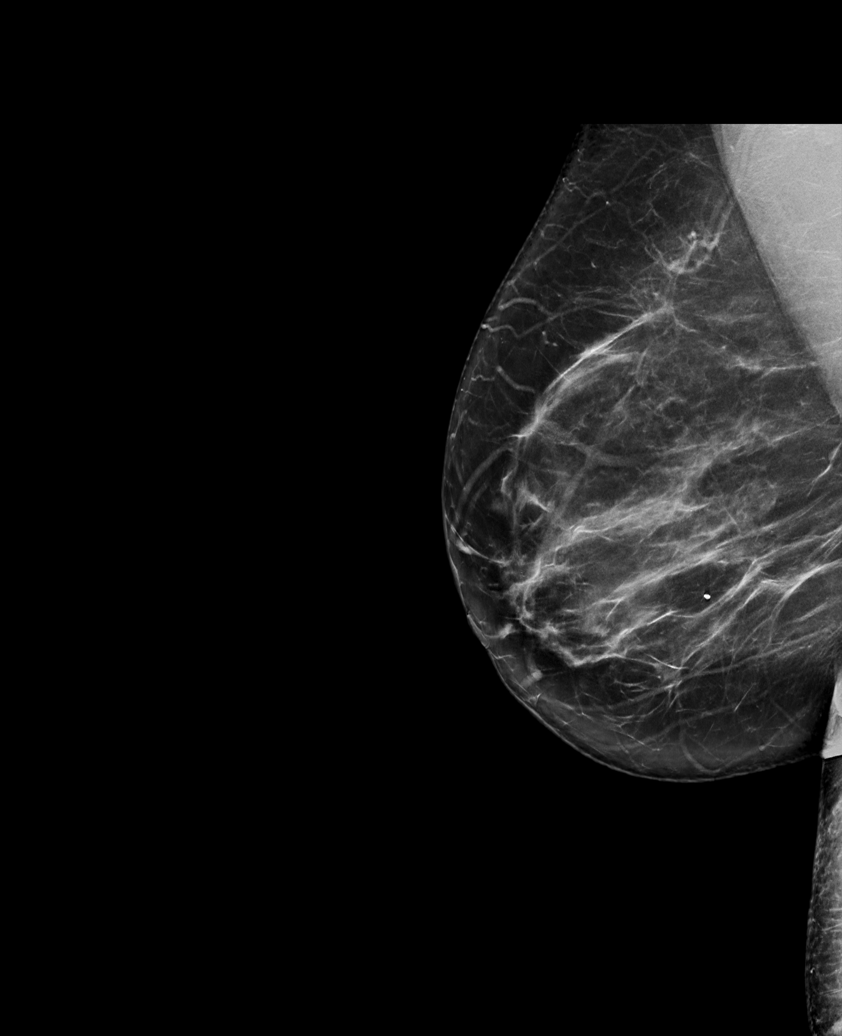

[L MLO synth-2D (1 of 2)]
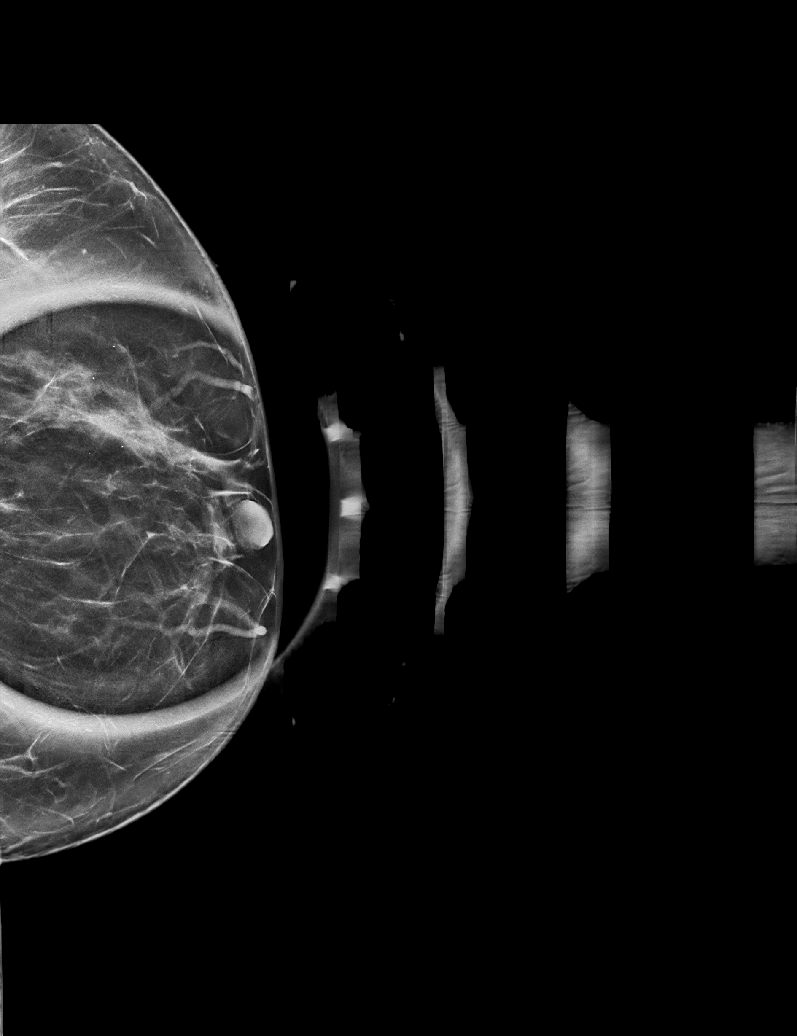

[L MLO synth-2D (2 of 2)]
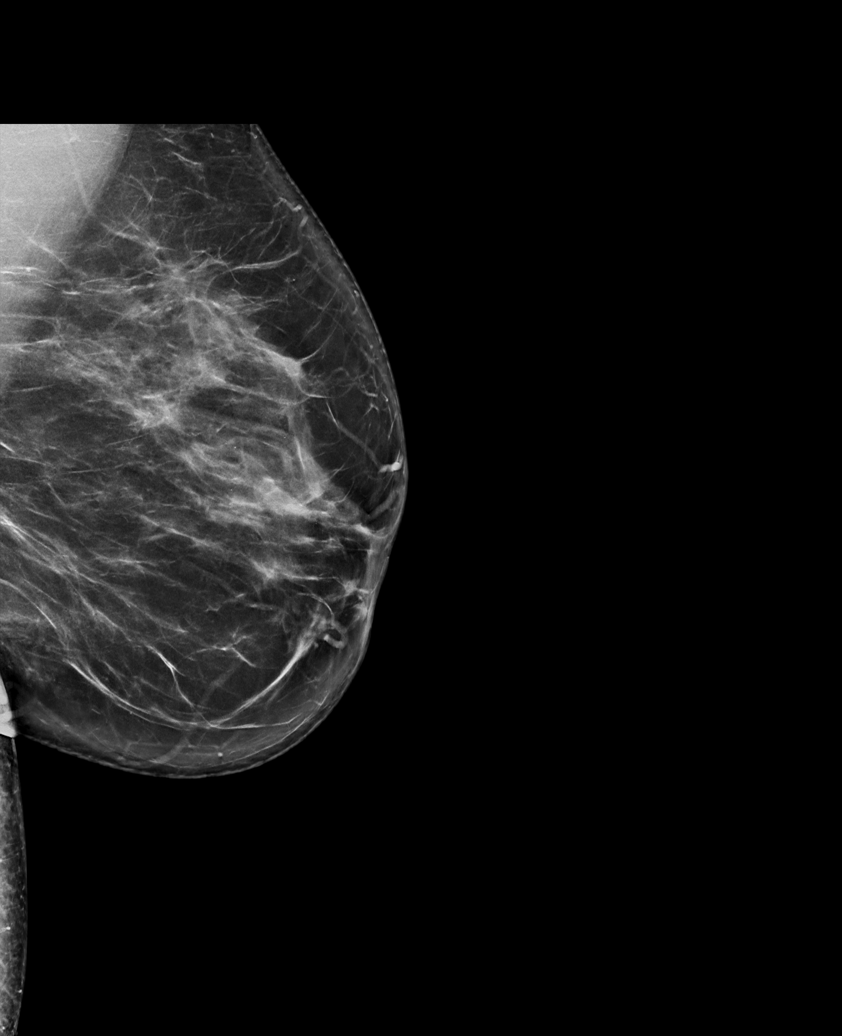

[L CC synth-2D]
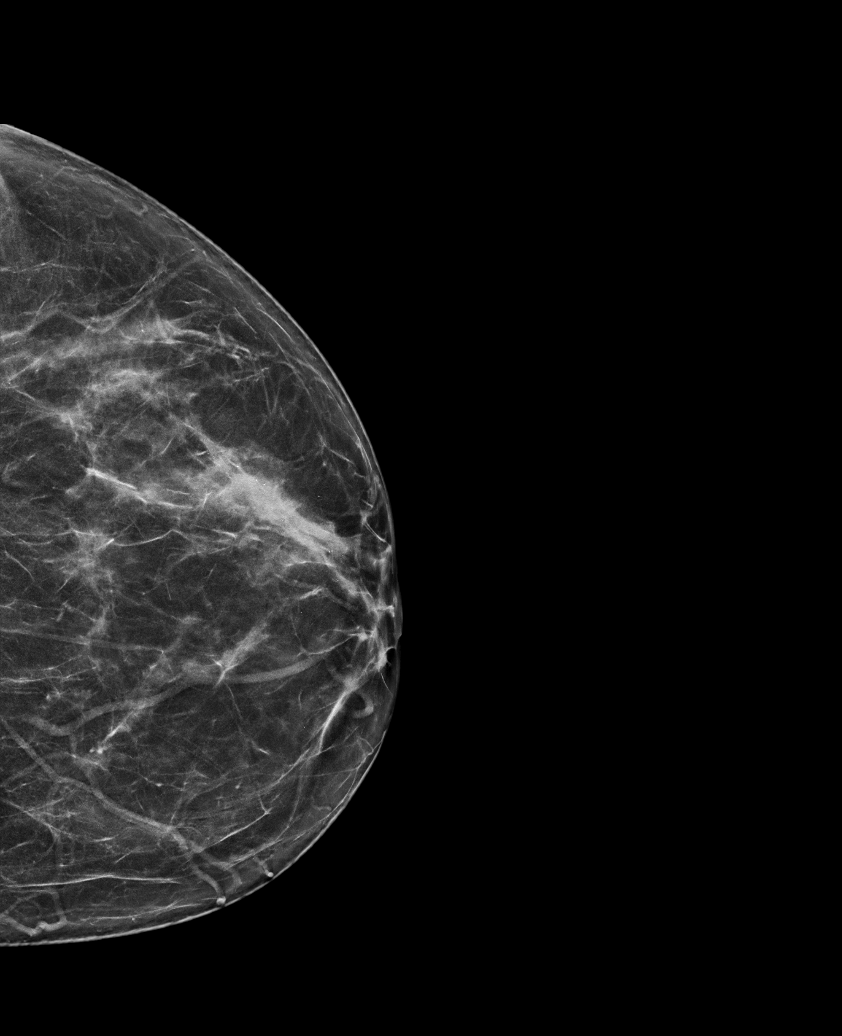

[L MLO tomo · tomo slice 35/68.0]
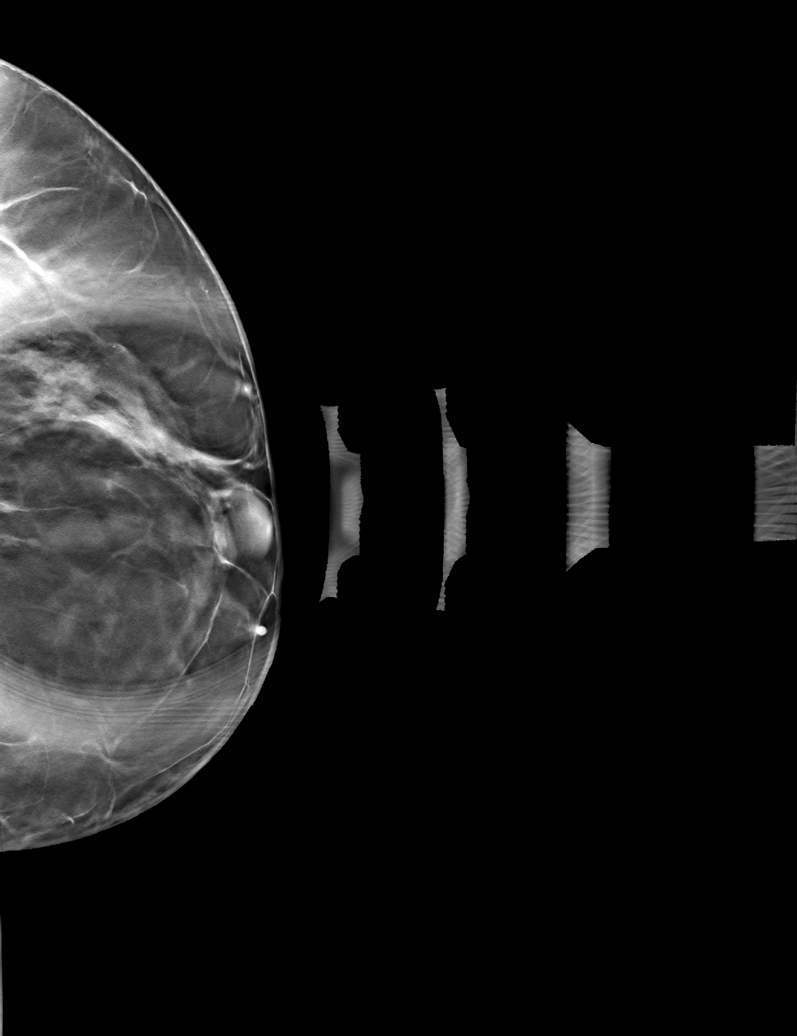

[6 of 30 positions shown; findings below may reference images not displayed]

ACR Breast Density Category b: There are scattered areas of
fibroglandular density.
FINDINGS: Tomosynthesis and synthesized full field CC and MLO views of both
breasts were obtained. Tomosynthesis and synthesized spot
compression tangential view of the subareolar LEFT breast was also
obtained.

No mammographic abnormality in the subareolar LEFT breast on the
spot compression images. No findings suspicious for malignancy in
the LEFT breast.

No findings suspicious for malignancy in the RIGHT breast.

Mammographic images were processed with CAD.

On correlative physical exam, the patient was able to express a milk
colored discharge from the LEFT nipple that emanated from multiple
duct orifices.

Targeted LEFT breast ultrasound is performed, showing normal caliber
ducts in the subareolar location. No intraductal mass, suspicious
solid mass or abnormal acoustic shadowing is identified.
IMPRESSION: 1. No mammographic or sonographic evidence of malignancy involving
the LEFT breast.
2. No mammographic evidence of malignancy involving the RIGHT
breast.
3. Benign physiologic nipple discharge based on the chronicity of
the discharge and the fact that it emanates from multiple duct
orifices.

RECOMMENDATION:
Screening mammogram in one year.(Code:[id number])

I discussed with the patient the fact that most nipple discharges
are physiologic/benign and a nipple discharge which arises from
multiple duct orifices is physiologic and requires no follow-up.

If the nipple discharge ever arises from a single duct orifice or
becomes bloody, surgical consultation and a bilateral breast MRI
without and with contrast would be recommended.

I have discussed the findings and recommendations with the patient.
If applicable, a reminder letter will be sent to the patient
regarding the next appointment.

BI-RADS CATEGORY  1: Negative.

## 2022-03-05 IMAGING — US US BREAST*L* LIMITED INC AXILLA
1 series · 3 of 3 positions shown · non-contrast
Comparison: Previous exam(s).

CLINICAL DATA: 59-year-old with a long-standing (30+ years) LEFT
nipple discharge which the patient states occurred only when
expressed for many years, though in the past couple of years the
discharge become intermittently spontaneous. She describes the
discharge as clear and occasionally milky or yellow in color, and
states that it emanates from multiple duct orifices.

Annual evaluation, RIGHT breast.
EXAM:
DIGITAL DIAGNOSTIC BILATERAL MAMMOGRAM WITH CAD AND TOMO
ULTRASOUND LEFT BREAST

[Series 1: us breast*left* limited inc axilla · 0.07mm/px · 3 of 3 slices shown]
[im 1/3]
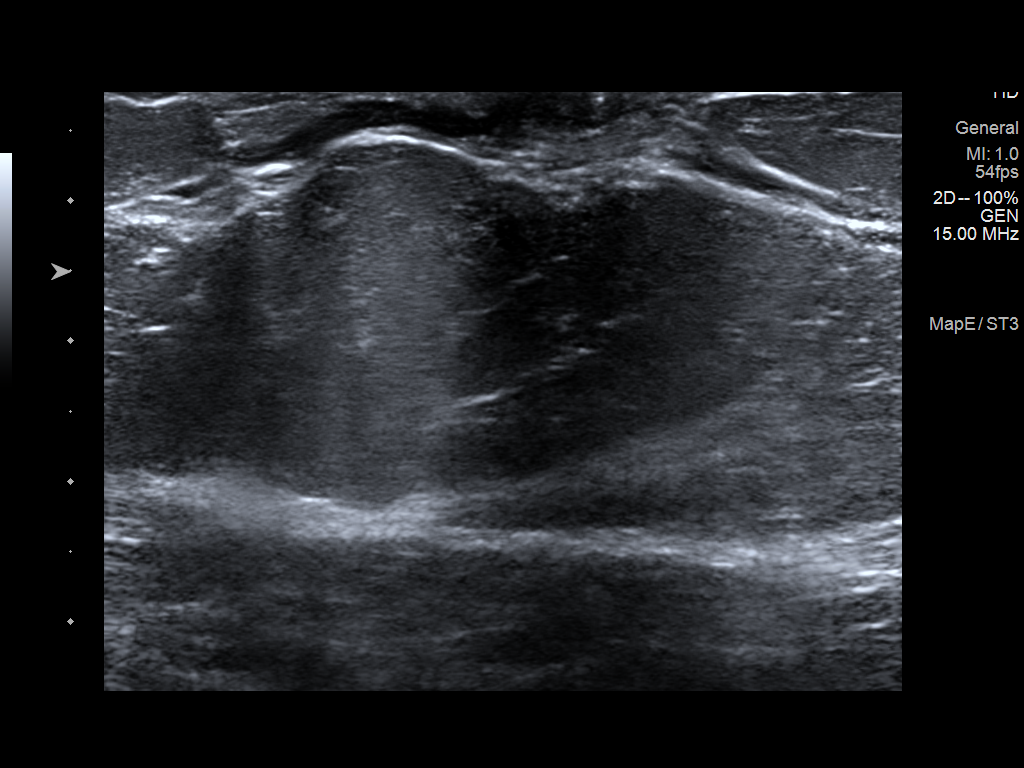
[im 2/3]
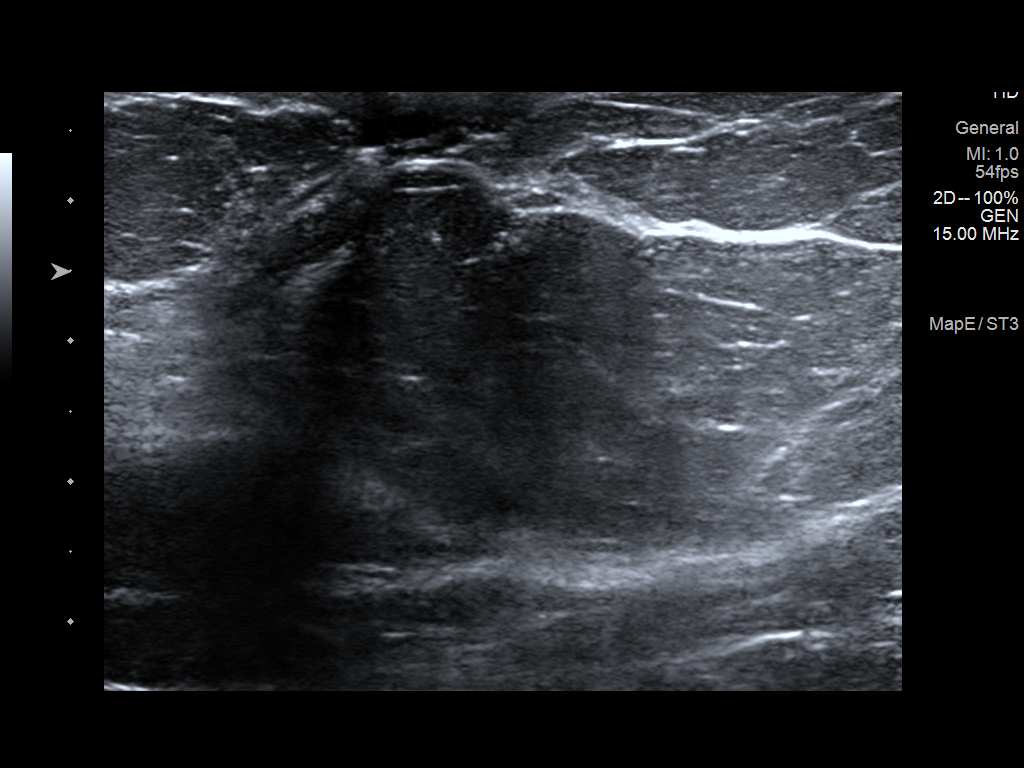
[im 3/3]
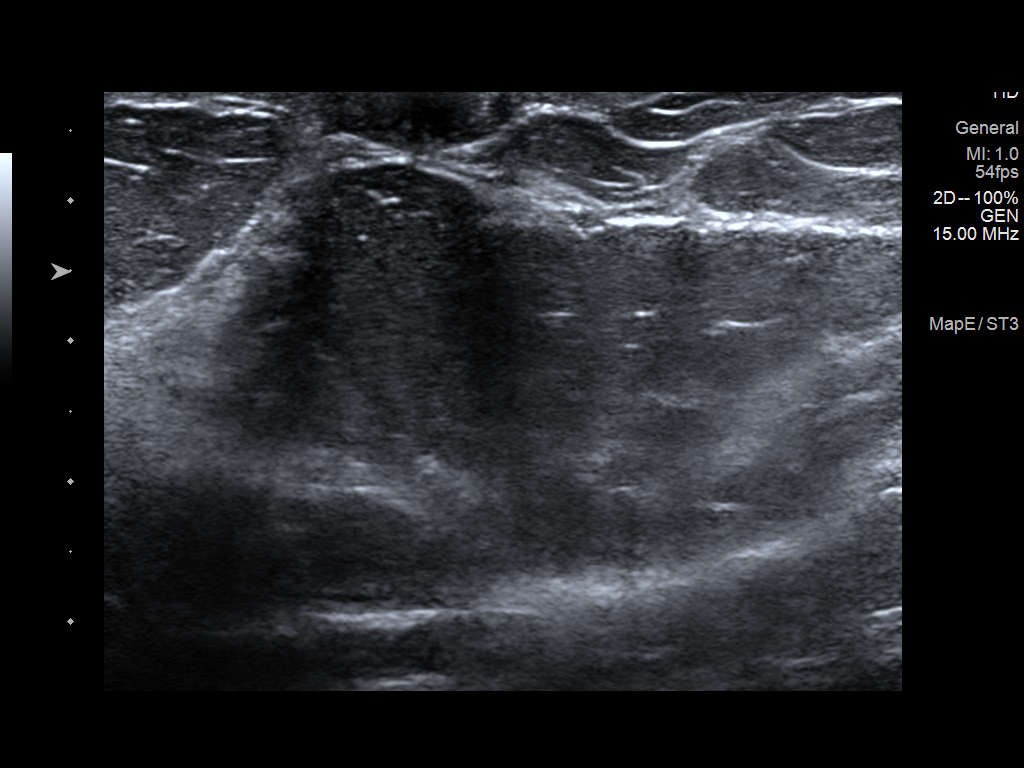

[3 of 3 positions shown; findings below may reference images not displayed]

ACR Breast Density Category b: There are scattered areas of
fibroglandular density.
FINDINGS: Tomosynthesis and synthesized full field CC and MLO views of both
breasts were obtained. Tomosynthesis and synthesized spot
compression tangential view of the subareolar LEFT breast was also
obtained.

No mammographic abnormality in the subareolar LEFT breast on the
spot compression images. No findings suspicious for malignancy in
the LEFT breast.

No findings suspicious for malignancy in the RIGHT breast.

Mammographic images were processed with CAD.

On correlative physical exam, the patient was able to express a milk
colored discharge from the LEFT nipple that emanated from multiple
duct orifices.

Targeted LEFT breast ultrasound is performed, showing normal caliber
ducts in the subareolar location. No intraductal mass, suspicious
solid mass or abnormal acoustic shadowing is identified.
IMPRESSION: 1. No mammographic or sonographic evidence of malignancy involving
the LEFT breast.
2. No mammographic evidence of malignancy involving the RIGHT
breast.
3. Benign physiologic nipple discharge based on the chronicity of
the discharge and the fact that it emanates from multiple duct
orifices.

RECOMMENDATION:
Screening mammogram in one year.(Code:[id number])

I discussed with the patient the fact that most nipple discharges
are physiologic/benign and a nipple discharge which arises from
multiple duct orifices is physiologic and requires no follow-up.

If the nipple discharge ever arises from a single duct orifice or
becomes bloody, surgical consultation and a bilateral breast MRI
without and with contrast would be recommended.

I have discussed the findings and recommendations with the patient.
If applicable, a reminder letter will be sent to the patient
regarding the next appointment.

BI-RADS CATEGORY  1: Negative.

## 2022-08-15 ENCOUNTER — Other Ambulatory Visit: Payer: Self-pay

## 2022-08-15 ENCOUNTER — Encounter (HOSPITAL_BASED_OUTPATIENT_CLINIC_OR_DEPARTMENT_OTHER): Payer: Self-pay

## 2022-08-15 ENCOUNTER — Emergency Department (HOSPITAL_BASED_OUTPATIENT_CLINIC_OR_DEPARTMENT_OTHER): Payer: Medicaid Other | Admitting: Radiology

## 2022-08-15 ENCOUNTER — Emergency Department (HOSPITAL_BASED_OUTPATIENT_CLINIC_OR_DEPARTMENT_OTHER)
Admission: EM | Admit: 2022-08-15 | Discharge: 2022-08-15 | Disposition: A | Discharge status: Home / Self Care | Payer: Medicaid Other | Attending: Emergency Medicine | Admitting: Emergency Medicine

## 2022-08-15 DIAGNOSIS — X58XXXA Exposure to other specified factors, initial encounter: Secondary | ICD-10-CM | POA: Diagnosis not present

## 2022-08-15 DIAGNOSIS — S46812A Strain of other muscles, fascia and tendons at shoulder and upper arm level, left arm, initial encounter: Secondary | ICD-10-CM | POA: Diagnosis not present

## 2022-08-15 DIAGNOSIS — I10 Essential (primary) hypertension: Secondary | ICD-10-CM | POA: Insufficient documentation

## 2022-08-15 DIAGNOSIS — Z79899 Other long term (current) drug therapy: Secondary | ICD-10-CM | POA: Diagnosis not present

## 2022-08-15 DIAGNOSIS — E119 Type 2 diabetes mellitus without complications: Secondary | ICD-10-CM | POA: Insufficient documentation

## 2022-08-15 DIAGNOSIS — S4992XA Unspecified injury of left shoulder and upper arm, initial encounter: Secondary | ICD-10-CM | POA: Diagnosis present

## 2022-08-15 LAB — BASIC METABOLIC PANEL
Anion gap: 9 (ref 5–15)
BUN: 10 mg/dL (ref 6–20)
CO2: 29 mmol/L (ref 22–32)
Calcium: 9.2 mg/dL (ref 8.9–10.3)
Chloride: 99 mmol/L (ref 98–111)
Creatinine, Ser: 0.77 mg/dL (ref 0.44–1.00)
GFR, Estimated: >60 mL/min (ref >60)
Glucose, Bld: 92 mg/dL (ref 70–99)
Potassium: 3.8 mmol/L (ref 3.5–5.1)
Sodium: 137 mmol/L (ref 135–145)

## 2022-08-15 LAB — TROPONIN I (HIGH SENSITIVITY)
Troponin I (High Sensitivity): 5 ng/L (ref <18)
Troponin I (High Sensitivity): 6 ng/L (ref <18)

## 2022-08-15 LAB — CBC
HCT: 42.9 % (ref 36.0–46.0)
Hemoglobin: 14.1 g/dL (ref 12.0–15.0)
MCH: 28.3 pg (ref 26.0–34.0)
MCHC: 32.9 g/dL (ref 30.0–36.0)
MCV: 86.1 fL (ref 80.0–100.0)
Platelets: 236 10*3/uL (ref 150–400)
RBC: 4.98 MIL/uL (ref 3.87–5.11)
RDW: 13.9 % (ref 11.5–15.5)
WBC: 4.9 10*3/uL (ref 4.0–10.5)
nRBC: 0 % (ref 0.0–0.2)

## 2022-08-15 NOTE — ED Provider Notes (Signed)
[place] EMERGENCY DEPARTMENT AT [facility] Provider Note   CSN: [id number] Arrival date & time: 08/15/22  2035     History  Chief Complaint  Patient presents with   Chest Pain    Sophia Cruz is a 62 y.o. female.  62 yo F with a cc or chest pain.  Patient tells me that it actually is more left-sided sharp back pain.  It started last night.  Started at rest while she was watching TV.  Not exertional.  She felt it went into her chest sometimes.  She checked her blood pressure multiple times and noted that it was elevated.  She continues to check her blood pressure throughout the day today.  Was still having some ongoing symptoms and decided to come here for evaluation.  Patient denies history of MI, denies smoking.  Denies family history of MI.  She has a history of hypertension hyperlipidemia and diabetes.  Patient denies history of PE or DVT denies hemoptysis denies unilateral lower extremity edema denies recent surgery immobilization hospitalization estrogen use or history of cancer.     Chest Pain      Home Medications Prior to Admission medications   Medication Sig Start Date End Date Taking? Authorizing Provider  albuterol (PROVENTIL HFA;VENTOLIN HFA) 108 (90 Base) MCG/ACT inhaler Inhale 2 puffs into the lungs every 6 (six) hours as needed for wheezing or shortness of breath.    [provider]  amoxicillin-clavulanate (AUGMENTIN) 875-125 MG tablet Take 1 tablet by mouth every 12 (twelve) hours. 05/16/16   Renne Crigler, PA-C  benzonatate (TESSALON) 100 MG capsule Take 1 capsule (100 mg total) by mouth every 8 (eight) hours. 11/05/18   Henderly, Britni A, PA-C  cetirizine-pseudoephedrine (ZYRTEC-D) 5-120 MG tablet Take 1 tablet by mouth 2 (two) times daily. 05/24/16   Janne Napoleon, NP  docusate sodium (COLACE) 100 MG capsule Take 200 mg by mouth daily.    [provider]  ferrous sulfate 325 (65 FE) MG tablet Take 1 tablet (325 mg total)  by mouth daily with breakfast. 12/11/13   Richarda Overlie, MD  Flaxseed, Linseed, (FLAX SEED OIL) 1000 MG CAPS Take 2,000 mg by mouth daily.    [provider]  fluticasone (FLONASE) 50 MCG/ACT nasal spray Place 2 sprays into the nose daily. 07/02/11 07/01/12  Reuben Likes, MD  HYDROcodone-acetaminophen (NORCO/VICODIN) 5-325 MG tablet Take 1 tablet by mouth every 6 (six) hours as needed for moderate pain. Reported on 07/26/2015    [provider]  medroxyPROGESTERone (PROVERA) 10 MG tablet Take 20 mg by mouth daily.     [provider]  Multiple Vitamins-Iron (MULTIVITAMINS WITH IRON) TABS tablet Take 1 tablet by mouth daily.    [provider]  phentermine (ADIPEX-P) 37.5 MG tablet Take 18.75 mg by mouth daily before breakfast. Takes 1/2 tab    [provider]      Allergies    Hydrocodone    Review of Systems   Review of Systems  Cardiovascular:  Positive for chest pain.    Physical Exam Updated Vital Signs BP 120/68   Pulse 71   Temp 98.1 F (36.7 C) (Oral)   Resp 16   Ht  (1.651 m)   Wt 112.6 kg   LMP 03/13/2013   SpO2 100%   BMI 41.31 kg/m  Physical Exam Vitals and nursing note reviewed.  Constitutional:      General: She is not in acute distress.    Appearance: She  is well-developed. She is not diaphoretic.  HENT:     Head: Normocephalic and atraumatic.  Eyes:     Pupils: Pupils are equal, round, and reactive to light.  Cardiovascular:     Rate and Rhythm: Normal rate and regular rhythm.     Heart sounds: No murmur heard.    No friction rub. Gallop present. S3 sounds present.  Pulmonary:     Effort: Pulmonary effort is normal.     Breath sounds: No wheezing or rales.  Abdominal:     General: There is no distension.     Palpations: Abdomen is soft.     Tenderness: There is no abdominal tenderness.  Musculoskeletal:        General: No tenderness.     Cervical back: Normal range of motion and neck supple.      Comments: Pain to the left trapezius muscle belly reproduces her symptoms.  Pulse motor and sensation intact left upper extremity.  Skin:    General: Skin is warm and dry.  Neurological:     Mental Status: She is alert and oriented to person, place, and time.  Psychiatric:        Behavior: Behavior normal.     ED Results / Procedures / Treatments   Labs (all labs ordered are listed, but only abnormal results are displayed) Labs Reviewed  BASIC METABOLIC PANEL  CBC  TROPONIN I (HIGH SENSITIVITY)  TROPONIN I (HIGH SENSITIVITY)    EKG EKG Interpretation  Date/Time:  Tuesday August 15 2022 20:44:25 EDT Ventricular Rate:  66 PR Interval:  158 QRS Duration: 82 QT Interval:  400 QTC Calculation: 419 R Axis:   44 Text Interpretation: Normal sinus rhythm Anterior infarct , age undetermined Abnormal ECG When compared with ECG of 05-Nov-2018 16:37, PREVIOUS ECG IS PRESENT Confirmed by Ernie Avena ([id number]) on 08/15/2022 10:10:25 PM  Radiology DG Chest 2 View  Result Date: 08/15/2022 CLINICAL DATA:  CP EXAM: CHEST - 2 VIEW COMPARISON:  11/05/2018 FINDINGS: Lungs are clear. Heart size and mediastinal contours are within normal limits. No effusion.  No pneumothorax. Visualized bones unremarkable. IMPRESSION: No acute cardiopulmonary disease. Electronically Signed   By: Corlis Leak M.D.   On: 08/15/2022 21:31    Procedures Procedures    Medications Ordered in ED Medications - No data to display  ED Course/ Medical Decision Making/ A&P                             Medical Decision Making  62 yo F with a cc of left sided chest pain.  This has been going on for a couple days.  Bilateral pain to the left trapezius muscle belly reproduces her symptoms.  2 troponins are negative.  EKG without concerning finding.  Electrolytes unremarkable.  Chest x-ray independently interpreted by me without focal infiltrate or pneumothorax.  Discharge home.  PCP follow-up.  11:50 PM:  I have discussed the  diagnosis/risks/treatment options with the patient.  Evaluation and diagnostic testing in the emergency department does not suggest an emergent condition requiring admission or immediate intervention beyond what has been performed at this time.  They will follow up with PCP. We also discussed returning to the ED immediately if new or worsening sx occur. We discussed the sx which are most concerning (e.g., sudden worsening pain, fever, inability to tolerate by mouth) that necessitate immediate return. Medications administered to the patient during their visit and any new prescriptions provided to  the patient are listed below.  Medications given during this visit Medications - No data to display   The patient appears reasonably screen and/or stabilized for discharge and I doubt any other medical condition or other [facility] requiring further screening, evaluation, or treatment in the ED at this time prior to discharge.          Final Clinical Impression(s) / ED Diagnoses Final diagnoses:  Trapezius strain, left, initial encounter    Rx / DC Orders ED Discharge Orders     None         Melene Plan, DO 08/15/22 2350

## 2022-08-15 NOTE — Discharge Instructions (Signed)
Take 4 over the counter ibuprofen tablets 3 times a day or 2 over-the-counter naproxen tablets twice a day for pain. Also take tylenol 1000mg(2 extra strength) four times a day.    

## 2022-08-15 NOTE — ED Triage Notes (Signed)
Patient here POV from Home.  Endorses Last PM she had Stabbing Pain to Left Upper Arm, left Chest Left Upper Back. Checked BP at that time and it was Elevated (All were above 164 Systolic).   81 ASA Last PM. History of HTN. Has been taking BP Medication as prescribed.   NAD Noted during Triage. A&Ox4. GCS 15. Ambulatory.

## 2022-09-15 ENCOUNTER — Emergency Department (HOSPITAL_BASED_OUTPATIENT_CLINIC_OR_DEPARTMENT_OTHER)
Admission: EM | Admit: 2022-09-15 | Discharge: 2022-09-15 | Disposition: A | Discharge status: Home / Self Care | Payer: Medicaid Other | Attending: Emergency Medicine | Admitting: Emergency Medicine

## 2022-09-15 ENCOUNTER — Encounter (HOSPITAL_BASED_OUTPATIENT_CLINIC_OR_DEPARTMENT_OTHER): Payer: Self-pay

## 2022-09-15 ENCOUNTER — Other Ambulatory Visit: Payer: Self-pay

## 2022-09-15 DIAGNOSIS — M25561 Pain in right knee: Secondary | ICD-10-CM | POA: Diagnosis present

## 2022-09-15 DIAGNOSIS — G8929 Other chronic pain: Secondary | ICD-10-CM | POA: Insufficient documentation

## 2022-09-15 MED ORDER — MELOXICAM 15 MG PO TABS: 15.0000 mg | ORAL_TABLET | Freq: Every day | ORAL | 0 refills | Status: AC

## 2022-09-15 NOTE — ED Provider Notes (Signed)
[place] EMERGENCY DEPARTMENT AT [facility]  Provider Note  CSN: [id number] Arrival date & time: 09/15/22 0554  History Chief Complaint  Patient presents with   Knee Pain    Sophia Cruz is a 62 y.o. female with history of chronic bilateral knee pain due to OA has been seen by Ortho and had steroid injections in the past with minimal benefit. She has had 2-3 days of worsening R knee pain worse with movement. No injury. No fevers.    Home Medications Prior to Admission medications   Medication Sig Start Date End Date Taking? Authorizing Provider  albuterol (PROVENTIL HFA;VENTOLIN HFA) 108 (90 Base) MCG/ACT inhaler Inhale 2 puffs into the lungs every 6 (six) hours as needed for wheezing or shortness of breath.    [provider]  cetirizine-pseudoephedrine (ZYRTEC-D) 5-120 MG tablet Take 1 tablet by mouth 2 (two) times daily. 05/24/16   Janne Napoleon, NP  docusate sodium (COLACE) 100 MG capsule Take 200 mg by mouth daily.    [provider]  ferrous sulfate 325 (65 FE) MG tablet Take 1 tablet (325 mg total) by mouth daily with breakfast. 12/11/13   Richarda Overlie, MD  Flaxseed, Linseed, (FLAX SEED OIL) 1000 MG CAPS Take 2,000 mg by mouth daily.    [provider]  fluticasone (FLONASE) 50 MCG/ACT nasal spray Place 2 sprays into the nose daily. 07/02/11 07/01/12  Reuben Likes, MD  medroxyPROGESTERone (PROVERA) 10 MG tablet Take 20 mg by mouth daily.     [provider]  meloxicam (MOBIC) 15 MG tablet Take 1 tablet (15 mg total) by mouth daily for 15 days. 09/15/22 09/30/22 Yes Pollyann Savoy, MD  Multiple Vitamins-Iron (MULTIVITAMINS WITH IRON) TABS tablet Take 1 tablet by mouth daily.    [provider]  phentermine (ADIPEX-P) 37.5 MG tablet Take 18.75 mg by mouth daily before breakfast. Takes 1/2 tab    [provider]     Allergies    Hydrocodone   Review of Systems   Review of Systems Please see HPI for  pertinent positives and negatives  Physical Exam BP (!) 167/101   Pulse 64   Temp 98.1 F (36.7 C) (Oral)   Resp 18   Ht 5\' 5"  (1.651 m)   Wt 108.9 kg   LMP 03/13/2013   SpO2 98%   BMI 39.94 kg/m   Physical Exam Vitals and nursing note reviewed.  HENT:     Head: Normocephalic.     Nose: Nose normal.  Eyes:     Extraocular Movements: Extraocular movements intact.  Pulmonary:     Effort: Pulmonary effort is normal.  Musculoskeletal:        General: Swelling (R knee diffusely swollen) and tenderness present.     Cervical back: Neck supple.     Comments: Decreased ROM of R knee due to pain, some swelling but no large effusion, erythema, warmth or signs of infection  Skin:    Findings: No rash (on exposed skin).  Neurological:     Mental Status: She is alert and oriented to person, place, and time.  Psychiatric:        Mood and Affect: Mood normal.     ED Results / Procedures / Treatments   EKG None  Procedures Procedures  Medications Ordered in the ED Medications - No data to display  Initial Impression and Plan  Patient here with exacerbation of chronic knee pain. She has been taking OTC Naprosyn with minimal  improvement. Does not wear a brace. Will provide a knee brace, Rx for Mobic, advised not to take more than the prescribed dose. Follow up with her Orthopedist for long term management.   ED Course       MDM Rules/Calculators/A&P Medical Decision Making Problems Addressed: Chronic pain of right knee: chronic illness or injury with exacerbation, progression, or side effects of treatment  Risk Prescription drug management.     Final Clinical Impression(s) / ED Diagnoses Final diagnoses:  Chronic pain of right knee    Rx / DC Orders ED Discharge Orders          Ordered    meloxicam (MOBIC) 15 MG tablet  Daily        09/15/22 0622             Pollyann Savoy, MD 09/15/22 [phone]

## 2022-09-15 NOTE — ED Notes (Signed)
Prescription for meloxicam reviewed with patient, patient states she already takes meloxicam, no record on file in chart. Requested different medication, MD Bernette Mayers notified, states pt will have to follow up with PCP/ortho if pain continues to be unalleviated by brace and meloxicam. Reviewed AVS with patient, patient expressed understanding of directions, denies further questions at this time.

## 2022-09-15 NOTE — ED Triage Notes (Signed)
Pt c/o right knee pain r/t arthritis, unrelieved by naproxen.
